# Patient Record
Sex: Male | Born: 1955 | Race: White | Hispanic: No | Marital: Married | Smoking: Never smoker
Health system: Southern US, Community
[De-identification: ages and names within clinical notes are randomized; demographics above are authoritative.]

## PROBLEM LIST (undated history)

## (undated) DIAGNOSIS — F1010 Alcohol abuse, uncomplicated: Secondary | ICD-10-CM

## (undated) DIAGNOSIS — X838XXA Intentional self-harm by other specified means, initial encounter: Secondary | ICD-10-CM

## (undated) DIAGNOSIS — R109 Unspecified abdominal pain: Secondary | ICD-10-CM

## (undated) DIAGNOSIS — F1910 Other psychoactive substance abuse, uncomplicated: Secondary | ICD-10-CM

## (undated) DIAGNOSIS — L400 Psoriasis vulgaris: Secondary | ICD-10-CM

## (undated) DIAGNOSIS — E119 Type 2 diabetes mellitus without complications: Secondary | ICD-10-CM

## (undated) DIAGNOSIS — E538 Deficiency of other specified B group vitamins: Secondary | ICD-10-CM

## (undated) DIAGNOSIS — D649 Anemia, unspecified: Secondary | ICD-10-CM

## (undated) DIAGNOSIS — Z6841 Body Mass Index (BMI) 40.0 and over, adult: Secondary | ICD-10-CM

## (undated) DIAGNOSIS — I10 Essential (primary) hypertension: Secondary | ICD-10-CM

## (undated) DIAGNOSIS — F32A Depression, unspecified: Secondary | ICD-10-CM

## (undated) DIAGNOSIS — M1990 Unspecified osteoarthritis, unspecified site: Secondary | ICD-10-CM

## (undated) DIAGNOSIS — E7800 Pure hypercholesterolemia, unspecified: Secondary | ICD-10-CM

## (undated) DIAGNOSIS — F329 Major depressive disorder, single episode, unspecified: Secondary | ICD-10-CM

## (undated) HISTORY — DX: Unspecified osteoarthritis, unspecified site: M19.90

## (undated) HISTORY — PX: ANKLE SURGERY: SHX546

---

## 2005-06-02 ENCOUNTER — Emergency Department: Payer: Self-pay | Admitting: Internal Medicine

## 2005-11-12 ENCOUNTER — Emergency Department: Payer: Self-pay | Admitting: General Practice

## 2009-08-26 ENCOUNTER — Observation Stay: Payer: Self-pay | Admitting: Surgery

## 2009-10-24 ENCOUNTER — Emergency Department: Payer: Self-pay | Admitting: Internal Medicine

## 2010-02-08 ENCOUNTER — Emergency Department: Payer: Self-pay | Admitting: Emergency Medicine

## 2010-02-28 ENCOUNTER — Ambulatory Visit: Payer: Self-pay | Admitting: Unknown Physician Specialty

## 2011-12-29 ENCOUNTER — Ambulatory Visit: Payer: Self-pay | Admitting: Family Medicine

## 2012-12-29 ENCOUNTER — Emergency Department: Payer: Self-pay | Admitting: Emergency Medicine

## 2012-12-29 LAB — CBC
HGB: 13.3 g/dL (ref 13.0–18.0)
MCH: 33.4 pg (ref 26.0–34.0)
MCHC: 35.6 g/dL (ref 32.0–36.0)
Platelet: 165 10*3/uL (ref 150–440)
RBC: 4 10*6/uL — ABNORMAL LOW (ref 4.40–5.90)
RDW: 12.5 % (ref 11.5–14.5)
WBC: 11 10*3/uL — ABNORMAL HIGH (ref 3.8–10.6)

## 2012-12-29 LAB — COMPREHENSIVE METABOLIC PANEL
Albumin: 3.3 g/dL — ABNORMAL LOW (ref 3.4–5.0)
Anion Gap: 8 (ref 7–16)
Bilirubin,Total: 0.6 mg/dL (ref 0.2–1.0)
Calcium, Total: 7.8 mg/dL — ABNORMAL LOW (ref 8.5–10.1)
Chloride: 106 mmol/L (ref 98–107)
Co2: 27 mmol/L (ref 21–32)
Creatinine: 0.71 mg/dL (ref 0.60–1.30)
EGFR (African American): >60
Glucose: 166 mg/dL — ABNORMAL HIGH (ref 65–99)
Potassium: 3.5 mmol/L (ref 3.5–5.1)
SGOT(AST): 61 U/L — ABNORMAL HIGH (ref 15–37)
Sodium: 141 mmol/L (ref 136–145)
Total Protein: 6.3 g/dL — ABNORMAL LOW (ref 6.4–8.2)

## 2012-12-29 LAB — ETHANOL
Ethanol %: 0.338 % (ref 0.000–0.080)
Ethanol: 338 mg/dL

## 2012-12-29 LAB — SALICYLATE LEVEL: Salicylates, Serum: <1.7 mg/dL

## 2012-12-29 LAB — ACETAMINOPHEN LEVEL: Acetaminophen: <2 ug/mL

## 2012-12-31 LAB — DRUG SCREEN, URINE

## 2013-01-06 ENCOUNTER — Emergency Department: Payer: Self-pay | Admitting: Internal Medicine

## 2013-02-22 ENCOUNTER — Emergency Department (HOSPITAL_COMMUNITY)
Admission: EM | Admit: 2013-02-22 | Discharge: 2013-02-23 | Discharge status: Against Medical Advice | Payer: BC Managed Care – PPO | Attending: Emergency Medicine | Admitting: Emergency Medicine

## 2013-02-22 ENCOUNTER — Encounter (HOSPITAL_COMMUNITY): Payer: Self-pay | Admitting: Emergency Medicine

## 2013-02-22 DIAGNOSIS — F329 Major depressive disorder, single episode, unspecified: Secondary | ICD-10-CM | POA: Insufficient documentation

## 2013-02-22 DIAGNOSIS — I10 Essential (primary) hypertension: Secondary | ICD-10-CM | POA: Insufficient documentation

## 2013-02-22 DIAGNOSIS — F1010 Alcohol abuse, uncomplicated: Secondary | ICD-10-CM

## 2013-02-22 DIAGNOSIS — F10939 Alcohol use, unspecified with withdrawal, unspecified: Secondary | ICD-10-CM | POA: Insufficient documentation

## 2013-02-22 DIAGNOSIS — F3289 Other specified depressive episodes: Secondary | ICD-10-CM | POA: Insufficient documentation

## 2013-02-22 DIAGNOSIS — F10929 Alcohol use, unspecified with intoxication, unspecified: Secondary | ICD-10-CM

## 2013-02-22 DIAGNOSIS — F10239 Alcohol dependence with withdrawal, unspecified: Secondary | ICD-10-CM | POA: Insufficient documentation

## 2013-02-22 HISTORY — DX: Major depressive disorder, single episode, unspecified: F32.9

## 2013-02-22 HISTORY — DX: Depression, unspecified: F32.A

## 2013-02-22 HISTORY — DX: Essential (primary) hypertension: I10

## 2013-02-22 LAB — CBC
Hemoglobin: 14.8 g/dL (ref 13.0–17.0)
MCH: 33.6 pg (ref 26.0–34.0)
MCHC: 36.5 g/dL — ABNORMAL HIGH (ref 30.0–36.0)
RDW: 13 % (ref 11.5–15.5)
WBC: 8.3 10*3/uL (ref 4.0–10.5)

## 2013-02-22 LAB — COMPREHENSIVE METABOLIC PANEL
ALT: 43 U/L (ref 0–53)
AST: 65 U/L — ABNORMAL HIGH (ref 0–37)
Albumin: 3.8 g/dL (ref 3.5–5.2)
Alkaline Phosphatase: 79 U/L (ref 39–117)
BUN: 22 mg/dL (ref 6–23)
Calcium: 9 mg/dL (ref 8.4–10.5)
Creatinine, Ser: 1.22 mg/dL (ref 0.50–1.35)
GFR calc Af Amer: 75 mL/min — ABNORMAL LOW (ref >90)
Glucose, Bld: 165 mg/dL — ABNORMAL HIGH (ref 70–99)
Potassium: 3.2 mEq/L — ABNORMAL LOW (ref 3.5–5.1)
Sodium: 133 mEq/L — ABNORMAL LOW (ref 135–145)
Total Protein: 7.2 g/dL (ref 6.0–8.3)

## 2013-02-22 LAB — RAPID URINE DRUG SCREEN, HOSP PERFORMED
Benzodiazepines: NOT DETECTED
Cocaine: NOT DETECTED
Opiates: NOT DETECTED
Tetrahydrocannabinol: NOT DETECTED

## 2013-02-22 LAB — ACETAMINOPHEN LEVEL: Acetaminophen (Tylenol), Serum: <15 ug/mL (ref 10–30)

## 2013-02-22 LAB — ETHANOL: Alcohol, Ethyl (B): 403 mg/dL (ref 0–11)

## 2013-02-22 MED ORDER — SODIUM CHLORIDE 0.9 % IV BOLUS (SEPSIS)
1000.0000 mL | Freq: Once | INTRAVENOUS | Status: AC
  Administered 2013-02-22: 1000 mL via INTRAVENOUS

## 2013-02-22 MED ORDER — VITAMIN B-1 100 MG PO TABS
100.0000 mg | ORAL_TABLET | Freq: Every day | ORAL | Status: DC
  Administered 2013-02-22: 100 mg via ORAL
  Filled 2013-02-22: qty 1

## 2013-02-22 MED ORDER — ONDANSETRON 4 MG PO TBDP
8.0000 mg | ORAL_TABLET | ORAL | Status: AC
  Administered 2013-02-22: 8 mg via ORAL
  Filled 2013-02-22: qty 2

## 2013-02-22 MED ORDER — THIAMINE HCL 100 MG/ML IJ SOLN: 100.0000 mg | Freq: Every day | INTRAMUSCULAR | Status: DC

## 2013-02-22 MED ORDER — LORAZEPAM 1 MG PO TABS: 0.0000 mg | ORAL_TABLET | Freq: Two times a day (BID) | ORAL | Status: DC

## 2013-02-22 MED ORDER — LORAZEPAM 1 MG PO TABS
0.0000 mg | ORAL_TABLET | Freq: Four times a day (QID) | ORAL | Status: DC
  Administered 2013-02-22 – 2013-02-23 (×2): 1 mg via ORAL
  Administered 2013-02-23: 2 mg via ORAL
  Filled 2013-02-22: qty 2
  Filled 2013-02-22 (×2): qty 1
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
## 2013-02-22 NOTE — ED Notes (Signed)
Peter Mccann, wife [phone]. Please call wife with update when known.

## 2013-02-22 NOTE — ED Notes (Signed)
Telepsych being performed at the bedside.

## 2013-02-22 NOTE — ED Provider Notes (Signed)
CSN: [id number]     Arrival date & time 02/22/13  1528 History   First MD Initiated Contact with Patient 02/22/13 1553     Chief Complaint  Patient presents with  . Medical Clearance   (Consider location/radiation/quality/duration/timing/severity/associated sxs/prior Treatment) HPI Comments: Patient presents to the ED with a chief complaint of intoxication.  He is accompanied by his family who state that they and the patient would like to have the patient admitted for detox.  Patient states that he drinks about 10 drinks per day.  Family members state that he has been very depressed and has been drunk since Thursday.  He denies any SI or HI.  Denies any other symptoms at this time.  Patient denies headache, blurred vision, new hearing loss, sore throat, chest pain, shortness of breath, nausea, vomiting, diarrhea, constipation, dysuria, peripheral edema, back pain, numbness or tingling of the extremities.   The history is provided by the patient. No language interpreter was used.    Past Medical History  Diagnosis Date  . Depression   . Hypertension    History reviewed. No pertinent past surgical history. History reviewed. No pertinent family history. History  Substance Use Topics  . Smoking status: Not on file  . Smokeless tobacco: Current User    Types: Chew  . Alcohol Use: Yes     Comment: heavy    Review of Systems  All other systems reviewed and are negative.    Allergies  Review of patient's allergies indicates no known allergies.  Home Medications  No current outpatient prescriptions on file. BP 121/54  Pulse 60  Temp(Src) 97.9 F (36.6 C) (Oral)  Resp 16  SpO2 96% Physical Exam  Nursing note and vitals reviewed. Constitutional: He is oriented to person, place, and time. He appears well-developed and well-nourished.  intoxicated  HENT:  Head: Normocephalic and atraumatic.  Eyes: Conjunctivae and EOM are normal. Pupils are equal, round, and reactive to light.  Right eye exhibits no discharge. Left eye exhibits no discharge. No scleral icterus.  Neck: Normal range of motion. Neck supple. No JVD present.  Cardiovascular: Normal rate, regular rhythm, normal heart sounds and intact distal pulses.  Exam reveals no gallop and no friction rub.   No murmur heard. Pulmonary/Chest: Effort normal and breath sounds normal. No respiratory distress. He has no wheezes. He has no rales. He exhibits no tenderness.  Abdominal: Soft. Bowel sounds are normal. He exhibits no distension and no mass. There is no tenderness. There is no rebound and no guarding.  Musculoskeletal: Normal range of motion. He exhibits no edema and no tenderness.  Neurological: He is alert and oriented to person, place, and time.  Skin: Skin is warm and dry.  Psychiatric:  intoxicated    ED Course  Procedures (including critical care time) Labs Review Labs Reviewed  ACETAMINOPHEN LEVEL  CBC  COMPREHENSIVE METABOLIC PANEL  ETHANOL  SALICYLATE LEVEL  URINE RAPID DRUG SCREEN (HOSP PERFORMED)   Results for orders placed during the hospital encounter of 02/22/13  ACETAMINOPHEN LEVEL      Result Value Range   Acetaminophen (Tylenol), Serum <15.0  10 - 30 ug/mL  CBC      Result Value Range   WBC 8.3  4.0 - 10.5 K/uL   RBC 4.41  4.22 - 5.81 MIL/uL   Hemoglobin 14.8  13.0 - 17.0 g/dL   HCT 59.5  63.8 - 75.6 %   MCV 92.1  78.0 - 100.0 fL   MCH 33.6  26.0 -  34.0 pg   MCHC 36.5 (*) 30.0 - 36.0 g/dL   RDW 16.1  09.6 - 04.5 %   Platelets 192  150 - 400 K/uL  COMPREHENSIVE METABOLIC PANEL      Result Value Range   Sodium 133 (*) 135 - 145 mEq/L   Potassium 3.2 (*) 3.5 - 5.1 mEq/L   Chloride 88 (*) 96 - 112 mEq/L   CO2 27  19 - 32 mEq/L   Glucose, Bld 165 (*) 70 - 99 mg/dL   BUN 22  6 - 23 mg/dL   Creatinine, Ser 4.09  0.50 - 1.35 mg/dL   Calcium 9.0  8.4 - 81.1 mg/dL   Total Protein 7.2  6.0 - 8.3 g/dL   Albumin 3.8  3.5 - 5.2 g/dL   AST 65 (*) 0 - 37 U/L   ALT 43  0 - 53 U/L    Alkaline Phosphatase 79  39 - 117 U/L   Total Bilirubin 0.6  0.3 - 1.2 mg/dL   GFR calc non Af Amer 65 (*) >90 mL/min   GFR calc Af Amer 75 (*) >90 mL/min  ETHANOL      Result Value Range   Alcohol, Ethyl (B) 403 (*) 0 - 11 mg/dL  SALICYLATE LEVEL      Result Value Range   Salicylate Lvl <2.0 (*) 2.8 - 20.0 mg/dL  URINE RAPID DRUG SCREEN (HOSP PERFORMED)      Result Value Range   Opiates NONE DETECTED  NONE DETECTED   Cocaine NONE DETECTED  NONE DETECTED   Benzodiazepines NONE DETECTED  NONE DETECTED   Amphetamines NONE DETECTED  NONE DETECTED   Tetrahydrocannabinol NONE DETECTED  NONE DETECTED   Barbiturates NONE DETECTED  NONE DETECTED     EKG Interpretation   None       MDM  No diagnosis found.  Patient who is intoxicated, requesting detox.  Will check basic labs, consult TTS, and re-evaluate.  CIWA is ordered.  11:14 PM  Patient evaluated by TTS.  Inpatient treatment for detox is recommended.  Berna Spare, from [facility] is working on a bed.    Roxy Horseman, PA-C 02/22/13 2314

## 2013-02-22 NOTE — ED Notes (Signed)
Pt reports being intoxicated and needs to gets sober. Denies any SI or HI. Family reports pt being depressed and malnourished.

## 2013-02-23 MED ORDER — ADULT MULTIVITAMIN W/MINERALS CH: 1.0000 | ORAL_TABLET | Freq: Every day | ORAL | Status: DC

## 2013-02-23 MED ORDER — FOLIC ACID 1 MG PO TABS: 1.0000 mg | ORAL_TABLET | Freq: Every day | ORAL | Status: DC

## 2013-02-23 MED ORDER — ONDANSETRON HCL 4 MG PO TABS
8.0000 mg | ORAL_TABLET | Freq: Two times a day (BID) | ORAL | Status: DC | PRN
  Administered 2013-02-23: 8 mg via ORAL
  Filled 2013-02-23: qty 2

## 2013-02-23 NOTE — ED Notes (Addendum)
Belongings inventoried. Placed in bins and locked with security.

## 2013-02-23 NOTE — ED Provider Notes (Signed)
Medical screening examination/treatment/procedure(s) were performed by non-physician practitioner and as supervising physician I was immediately available for consultation/collaboration.  Flint Melter, MD 02/23/13 [phone]

## 2013-02-23 NOTE — ED Provider Notes (Signed)
11:41 AM Pt is a 57 y.o. M with history of depression, hypertension, hyperlipidemia and alcohol abuse who presented to the emergency department 20 hours ago requesting detox from alcohol. He states that his last episode of sobriety lasted for 7 years and this was approximately 10 years ago. He reports he drinks a fifth of liquor a day. Denies any history of complicated withdrawal O. withdrawal seizures. Denies any suicidal or homicidal ideation. No hallucinations or delusions. Patient reports that he is not feeling well, feels anxious and nauseous and would like discharge home. I have offered him multiple medications to control his symptoms and have strongly encouraged him to stay in the hospital for placement for alcohol detox. He does not meet criteria at this point for medical detox and I feel his symptoms can be managed with oral Ativan and Zofran. Patient reports that he would still like to go home. He understands there are risks with leaving the hospital AGAINST MEDICAL ADVICE. He is competent to make this decision. He is able to verbalize the risks back to me. His wife at bedside agrees with the plan. Have discussed with them return precautions and returning to the ED if they change their mind. He has no current safety concerns. He does not want to stay to receive discharge paperwork or discharge medications to help control symptoms at home. I believe that he plans to go home and continue drinking.  Layla Maw Ward, DO 02/23/13 1147

## 2013-02-23 NOTE — ED Notes (Signed)
Pt d/c'd with wife- aware of all risks of leaving, stressed importance of staying and admission. Diaphoretic, tremors noticeable, ambulated to exit

## 2013-02-23 NOTE — ED Notes (Signed)
Pt states has drank heavily for 6 months -- 1 fifth vodka/day and also has been drinking for 5 years. Prior to that was sober for 7 years.

## 2013-02-23 NOTE — ED Notes (Signed)
Attempted to convince pt to stay. Still wanting to leave. Encouraged pt to return if withdrawal symptoms occur. Dr. Oletta Lamas informed. Resource guide given.

## 2013-02-23 NOTE — ED Notes (Signed)
Pt spoke with wife on the phone--will be in to visit. Unsure if he wants treatment at this time. States will talk with wife once she gets here.

## 2013-02-23 NOTE — ED Notes (Signed)
Pt. Arrived in POD C actively vomiting. Zofran given. Pt. States "I'm nauseous because of the chew I have in my mouth". Pt. Told of policies regarding tobacco use. Pt. Spit out tobacco in sink and rinsed out mouth. Pt. States he is still nauseous but is not vomiting at this time.

## 2016-01-22 ENCOUNTER — Other Ambulatory Visit: Payer: Self-pay | Admitting: Family Medicine

## 2016-03-23 ENCOUNTER — Other Ambulatory Visit: Payer: Self-pay | Admitting: Family Medicine

## 2016-03-26 ENCOUNTER — Other Ambulatory Visit: Payer: Self-pay | Admitting: Family Medicine

## 2016-04-26 ENCOUNTER — Other Ambulatory Visit: Payer: Self-pay | Admitting: Family Medicine

## 2016-05-09 ENCOUNTER — Other Ambulatory Visit: Payer: Self-pay | Admitting: Family Medicine

## 2016-05-23 ENCOUNTER — Other Ambulatory Visit: Payer: Self-pay | Admitting: Family Medicine

## 2016-08-06 ENCOUNTER — Encounter: Payer: Self-pay | Admitting: Emergency Medicine

## 2016-08-06 ENCOUNTER — Emergency Department
Admission: EM | Admit: 2016-08-06 | Discharge: 2016-08-07 | Disposition: A | Discharge status: Home / Self Care | Payer: BLUE CROSS/BLUE SHIELD | Attending: Emergency Medicine | Admitting: Emergency Medicine

## 2016-08-06 DIAGNOSIS — F17290 Nicotine dependence, other tobacco product, uncomplicated: Secondary | ICD-10-CM | POA: Diagnosis not present

## 2016-08-06 DIAGNOSIS — F1010 Alcohol abuse, uncomplicated: Secondary | ICD-10-CM | POA: Insufficient documentation

## 2016-08-06 DIAGNOSIS — F329 Major depressive disorder, single episode, unspecified: Secondary | ICD-10-CM | POA: Diagnosis not present

## 2016-08-06 DIAGNOSIS — R45851 Suicidal ideations: Secondary | ICD-10-CM | POA: Diagnosis present

## 2016-08-06 DIAGNOSIS — Z7982 Long term (current) use of aspirin: Secondary | ICD-10-CM | POA: Diagnosis not present

## 2016-08-06 DIAGNOSIS — I10 Essential (primary) hypertension: Secondary | ICD-10-CM | POA: Diagnosis not present

## 2016-08-06 DIAGNOSIS — E119 Type 2 diabetes mellitus without complications: Secondary | ICD-10-CM | POA: Diagnosis not present

## 2016-08-06 DIAGNOSIS — Z79899 Other long term (current) drug therapy: Secondary | ICD-10-CM | POA: Insufficient documentation

## 2016-08-06 DIAGNOSIS — Z7984 Long term (current) use of oral hypoglycemic drugs: Secondary | ICD-10-CM | POA: Insufficient documentation

## 2016-08-06 DIAGNOSIS — F32A Depression, unspecified: Secondary | ICD-10-CM

## 2016-08-06 DIAGNOSIS — Z046 Encounter for general psychiatric examination, requested by authority: Secondary | ICD-10-CM

## 2016-08-06 HISTORY — DX: Type 2 diabetes mellitus without complications: E11.9

## 2016-08-06 HISTORY — DX: Pure hypercholesterolemia, unspecified: E78.00

## 2016-08-06 HISTORY — DX: Alcohol abuse, uncomplicated: F10.10

## 2016-08-06 LAB — CBC
HCT: 41.2 % (ref 40.0–52.0)
HEMOGLOBIN: 14.2 g/dL (ref 13.0–18.0)
MCH: 31.9 pg (ref 26.0–34.0)
MCHC: 34.3 g/dL (ref 32.0–36.0)
MCV: 92.9 fL (ref 80.0–100.0)
PLATELETS: 213 10*3/uL (ref 150–440)
RBC: 4.44 MIL/uL (ref 4.40–5.90)
RDW: 14 % (ref 11.5–14.5)
WBC: 13.3 10*3/uL — AB (ref 3.8–10.6)

## 2016-08-06 LAB — COMPREHENSIVE METABOLIC PANEL
ALT: 44 U/L (ref 17–63)
AST: 51 U/L — ABNORMAL HIGH (ref 15–41)
Albumin: 3.8 g/dL (ref 3.5–5.0)
Alkaline Phosphatase: 82 U/L (ref 38–126)
Anion gap: 13 (ref 5–15)
BILIRUBIN TOTAL: 0.9 mg/dL (ref 0.3–1.2)
BUN: 21 mg/dL — AB (ref 6–20)
CO2: 24 mmol/L (ref 22–32)
Calcium: 8.3 mg/dL — ABNORMAL LOW (ref 8.9–10.3)
Chloride: 103 mmol/L (ref 101–111)
Creatinine, Ser: 0.95 mg/dL (ref 0.61–1.24)
GFR calc non Af Amer: >60 mL/min (ref >60)
Glucose, Bld: 118 mg/dL — ABNORMAL HIGH (ref 65–99)
POTASSIUM: 3.5 mmol/L (ref 3.5–5.1)
Sodium: 140 mmol/L (ref 135–145)
TOTAL PROTEIN: 7.2 g/dL (ref 6.5–8.1)

## 2016-08-06 LAB — ETHANOL: Alcohol, Ethyl (B): 415 mg/dL (ref <5)

## 2016-08-06 LAB — ACETAMINOPHEN LEVEL: Acetaminophen (Tylenol), Serum: <10 ug/mL — ABNORMAL LOW (ref 10–30)

## 2016-08-06 LAB — SALICYLATE LEVEL: Salicylate Lvl: <7 mg/dL (ref 2.8–30.0)

## 2016-08-06 MED ORDER — HALOPERIDOL LACTATE 5 MG/ML IJ SOLN
INTRAMUSCULAR | Status: AC
  Administered 2016-08-06: 5 mg via INTRAMUSCULAR
  Filled 2016-08-06: qty 1

## 2016-08-06 MED ORDER — MIDAZOLAM HCL 2 MG/2ML IJ SOLN
INTRAMUSCULAR | Status: AC
  Filled 2016-08-06: qty 2

## 2016-08-06 MED ORDER — LORAZEPAM 2 MG PO TABS: 0.0000 mg | ORAL_TABLET | Freq: Two times a day (BID) | ORAL | Status: DC

## 2016-08-06 MED ORDER — SODIUM CHLORIDE 0.9 % IV BOLUS (SEPSIS)
1000.0000 mL | Freq: Once | INTRAVENOUS | Status: AC
  Administered 2016-08-06: 1000 mL via INTRAVENOUS

## 2016-08-06 MED ORDER — LORAZEPAM 2 MG PO TABS
0.0000 mg | ORAL_TABLET | Freq: Four times a day (QID) | ORAL | Status: DC
  Administered 2016-08-07: 1 mg via ORAL

## 2016-08-06 MED ORDER — MIDAZOLAM HCL 2 MG/2ML IJ SOLN
2.0000 mg | Freq: Once | INTRAMUSCULAR | Status: AC
  Administered 2016-08-06: 2 mg via INTRAMUSCULAR

## 2016-08-06 MED ORDER — HALOPERIDOL LACTATE 5 MG/ML IJ SOLN
5.0000 mg | Freq: Once | INTRAMUSCULAR | Status: AC
  Administered 2016-08-06: 5 mg via INTRAMUSCULAR

## 2016-08-06 MED ORDER — DIPHENHYDRAMINE HCL 50 MG/ML IJ SOLN
25.0000 mg | Freq: Once | INTRAMUSCULAR | Status: AC
  Administered 2016-08-06: 25 mg via INTRAMUSCULAR

## 2016-08-06 MED ORDER — DIPHENHYDRAMINE HCL 50 MG/ML IJ SOLN
INTRAMUSCULAR | Status: AC
  Filled 2016-08-06: qty 1

## 2016-08-06 MED ORDER — THIAMINE HCL 100 MG/ML IJ SOLN: 100.0000 mg | Freq: Every day | INTRAMUSCULAR | Status: DC

## 2016-08-06 MED ORDER — VITAMIN B-1 100 MG PO TABS: 100.0000 mg | ORAL_TABLET | Freq: Every day | ORAL | Status: DC

## 2016-08-06 NOTE — ED Triage Notes (Signed)
Pt admits to being highly intoxicated; slurred speech with cursing in triage; wife and son say pt has been on a drinking binge for several days and expressing suicidal thoughts today; wife says pt has fallen several times today without injury; pt denies falls; pt says he's tired of living and just wants to shoot himself;

## 2016-08-06 NOTE — ED Notes (Signed)
Pt admits to drinking 1/2 pint of vodka per day. Pt admits to being a "functional drunk" even at work

## 2016-08-06 NOTE — ED Notes (Addendum)
Pt got angry , yelling, argumentative and pulling off EKG cords, and leaned forward -IV infiltrated. Paused fluids until pt calms ; MD aware

## 2016-08-06 NOTE — ED Provider Notes (Signed)
[facility] Emergency Department Provider Note  ____________________________________________   First MD Initiated Contact with Patient 08/06/16 2153     (approximate)  I have reviewed the triage vital signs and the nursing notes.   HISTORY  Chief Complaint Suicidal  Patient is heavily intoxicated which limits his ability to give a reliable history  HPI Peter Mccann is a 61 y.o. male with a history of heavy and chronic alcohol abuse who presents with his family by private vehicle for evaluation of worsening depression, suicidal ideation and threats, and heavy alcohol intoxication.  The patient is uncooperative and hostile, threatening to "kick your ass", and had to be physically redirected by security officers and given calming medications since he does not have the capacity to make any of his own decisions, is threatening suicide, and is threatening physical violence on others.  His son and wife confirmed that he is a chronic alcoholic but that his depression has been increasing recently.  Prior to coming to the emergency department he asked his son to bring him, stated that he knows he needs help, he feels like his life is worthless and does not want to go on living.  His son reports that he has multiple firearms and multiple ways that he could kill himself.  They state that his drinking has been getting gradually worse but that he has essentially been drinking constantly for the last 3 days.   Past Medical History:  Diagnosis Date  . Alcohol abuse   . Depression   . Diabetes (HCC)   . High cholesterol   . Hypertension     There are no active problems to display for this patient.   History reviewed. No pertinent surgical history.  Prior to Admission medications   Medication Sig Start Date End Date Taking? Authorizing Provider  atorvastatin (LIPITOR) 40 MG tablet Take 40 mg by mouth daily. 02/22/13  Yes Historical Provider, MD  PARoxetine (PAXIL)  20 MG tablet TAKE THREE TABLETS BY MOUTH DAILY 03/27/16  Yes Teah Doles-Johnson, NP  telmisartan-hydrochlorothiazide (MICARDIS HCT) 80-25 MG per tablet Take 1 tablet by mouth daily. 02/01/13  Yes Historical Provider, MD    Allergies Patient has no known allergies.  History reviewed. No pertinent family history.  Social History Social History  Substance Use Topics  . Smoking status: Never Smoker  . Smokeless tobacco: Current User    Types: Snuff  . Alcohol use Yes     Comment: heavy, chronic    Review of Systems Level 5 caveat:  history/ROS limited by acute/critical illness  ____________________________________________   PHYSICAL EXAM:  VITAL SIGNS: ED Triage Vitals  Enc Vitals Group     BP 08/06/16 2113 (!) 84/51     Pulse Rate 08/06/16 2113 67     Resp 08/06/16 2113 18     Temp 08/06/16 2113 97.7 F (36.5 C)     Temp Source 08/06/16 2113 Oral     SpO2 08/06/16 2113 97 %     Weight 08/06/16 2113 265 lb (120.2 kg)     Height 08/06/16 2113 5' 8.5" (1.74 m)     Head Circumference --      Peak Flow --      Pain Score 08/06/16 2114 0     Pain Loc --      Pain Edu? --      Excl. in GC? --     Constitutional: Alert And conversant but slurred speech and obviously heavily intoxicated. Eyes: Conjunctivae are  normal. PERRL. EOMI. Head: Atraumatic. Nose: No congestion/rhinnorhea. Mouth/Throat: Mucous membranes are moist. Neck: No stridor.  No meningeal signs.   Cardiovascular: Normal rate, regular rhythm. Good peripheral circulation. Grossly normal heart sounds. Respiratory: Normal respiratory effort.  No retractions. Lungs CTAB. Gastrointestinal: Soft and nontender. No distention.  Musculoskeletal: No lower extremity tenderness nor edema. No gross deformities of extremities. Neurologic:  Heavily slurred speech. Unsteady on feet, but no focal neuro deficits. Skin:  Skin is warm, dry and intact. No rash noted. Psychiatric: slurred speech, hostile behavior, danger to  himself and others without the capacity to make his own decisions.  Endorsed suicidal ideation to son and wife prior to coming to ED, asked for help, and has access to firearms and vehicles.  ____________________________________________   LABS (all labs ordered are listed, but only abnormal results are displayed)  Labs Reviewed  COMPREHENSIVE METABOLIC PANEL - Abnormal; Notable for the following:       Result Value   Glucose, Bld 118 (*)    BUN 21 (*)    Calcium 8.3 (*)    AST 51 (*)    All other components within normal limits  CBC - Abnormal; Notable for the following:    WBC 13.3 (*)    All other components within normal limits  ETHANOL  SALICYLATE LEVEL  ACETAMINOPHEN LEVEL  URINE DRUG SCREEN, QUALITATIVE ([facility] ONLY)   ____________________________________________  EKG  None - EKG not ordered by ED physician ____________________________________________  RADIOLOGY   No results found.  ____________________________________________   PROCEDURES  Critical Care performed: Yes, see critical care procedure note(s)   Procedure(s) performed:   .Critical Care Performed by: Loleta RoseFORBACH, Gera Inboden Authorized by: Loleta RoseFORBACH, Laithan Conchas   Critical care provider statement:    Critical care time (minutes):  30   Critical care time was exclusive of:  Separately billable procedures and treating other patients   Critical care was necessary to treat or prevent imminent or life-threatening deterioration of the following conditions:  Toxidrome (alcohol; required emergent psychiatric intervention for suicidal ideation/threats)   Critical care was time spent personally by me on the following activities:  Development of treatment plan with patient or surrogate, discussions with consultants, evaluation of patient's response to treatment, examination of patient, obtaining history from patient or surrogate, ordering and performing treatments and interventions, ordering and review of laboratory studies,  ordering and review of radiographic studies, pulse oximetry, re-evaluation of patient's condition and review of old charts     ____________________________________________   INITIAL IMPRESSION / ASSESSMENT AND PLAN / ED COURSE  Pertinent labs & imaging results that were available during my care of the patient were reviewed by me and considered in my medical decision making (see chart for details).  The patient is heavily intoxicated and threatening myself and staff members.  He does not have the capacity to make his own decisions and required calming agents to keep him and those around him safe.  I had an extensive discussion with the patient's son and wife and they both agree that he very much needs help, and then a fact he was asking for prior to coming here.  I placed him under emergency commitment in order to have the security officers redirect him and give him the calming medications which include Haldol 5 mg, Versed 2 mg, and Benadryl 25 mg, all given intramuscularly.  He initially was found to be hypotensive in triage but I do not believe these were accurate measurements given that the patient was ambulatory and in no  distress.  After he calmed down his blood pressure was within normal limits.  I am giving a small fluid bolus and we will reassess.  He will need psychiatric consultation when he is clinically sober.      ____________________________________________  FINAL CLINICAL IMPRESSION(S) / ED DIAGNOSES  Final diagnoses:  Depression, unspecified depression type  Suicidal ideation  Alcohol abuse  Involuntary commitment     MEDICATIONS GIVEN DURING THIS VISIT:  Medications  LORazepam (ATIVAN) tablet 0-4 mg (not administered)    Followed by  LORazepam (ATIVAN) tablet 0-4 mg (not administered)  thiamine (VITAMIN B-1) tablet 100 mg (not administered)    Or  thiamine (B-1) injection 100 mg (not administered)  sodium chloride 0.9 % bolus 1,000 mL (1,000 mLs Intravenous  Restarted 08/06/16 2229)  diphenhydrAMINE (BENADRYL) injection 25 mg (25 mg Intramuscular Given 08/06/16 2210)  haloperidol lactate (HALDOL) injection 5 mg (5 mg Intramuscular Given 08/06/16 2210)  midazolam (VERSED) injection 2 mg (2 mg Intramuscular Given 08/06/16 2210)     NEW OUTPATIENT MEDICATIONS STARTED DURING THIS VISIT:  New Prescriptions   No medications on file    Modified Medications   No medications on file    Discontinued Medications   ANDROGEL 50 MG/5GM GEL         Note:  This document was prepared using Dragon voice recognition software and may include unintentional dictation errors.    Loleta Rose, MD 08/06/16 [phone]

## 2016-08-06 NOTE — ED Notes (Signed)
Assigned as Recruitment consultantsafety sitter. Pt asleep and safe.AS

## 2016-08-06 NOTE — Progress Notes (Signed)
TTS is unable to assess the patient at this time. Patient has a BAC of 415. Patient was combative and was medicated.

## 2016-08-06 NOTE — ED Notes (Addendum)
Pt's family- wife and son left for home. They stated that pt can be very aggressive in the morning after he wakes up and finds that he is not at work but in the hospital. Concerned of safety in the morning and if "there is going to be something in place"

## 2016-08-07 LAB — URINE DRUG SCREEN, QUALITATIVE (ARMC ONLY)
AMPHETAMINES, UR SCREEN: NOT DETECTED
BENZODIAZEPINE, UR SCRN: POSITIVE — AB
Barbiturates, Ur Screen: NOT DETECTED
CANNABINOID 50 NG, UR ~~LOC~~: NOT DETECTED
Cocaine Metabolite,Ur ~~LOC~~: NOT DETECTED
MDMA (Ecstasy)Ur Screen: NOT DETECTED
Methadone Scn, Ur: NOT DETECTED
Opiate, Ur Screen: NOT DETECTED
Phencyclidine (PCP) Ur S: NOT DETECTED
Tricyclic, Ur Screen: NOT DETECTED

## 2016-08-07 MED ORDER — SODIUM CHLORIDE 0.9 % IV BOLUS (SEPSIS)
1000.0000 mL | Freq: Once | INTRAVENOUS | Status: AC
  Administered 2016-08-07: 1000 mL via INTRAVENOUS

## 2016-08-07 MED ORDER — LORAZEPAM 1 MG PO TABS
ORAL_TABLET | ORAL | Status: AC
  Filled 2016-08-07: qty 1

## 2016-08-07 NOTE — ED Notes (Signed)
BEHAVIORAL HEALTH ROUNDING Patient sleeping: No. Patient alert and oriented: yes Behavior appropriate: Yes.  ; If no, describe:  Nutrition and fluids offered: yes Toileting and hygiene offered: Yes  Sitter present: q15 minute observations and security  monitoring Law enforcement present: Yes  ODS  

## 2016-08-07 NOTE — ED Provider Notes (Signed)
-----------------------------------------   6:46 AM on 08/07/2016 -----------------------------------------   Blood pressure 103/64, pulse 64, temperature 97.7 F (36.5 C), temperature source Oral, resp. rate 18, height 5' 8.5" (1.74 m), weight 265 lb (120.2 kg), SpO2 96 %.  The patient had no acute events since last update.  Calm and cooperative at this time.  The patient is awake at this time we will have psych evaluate him.     Rebecka ApleyAllison P Trenise Turay, MD 08/07/16 [phone]

## 2016-08-07 NOTE — ED Notes (Signed)
As pt is sleeping soundly due to medication given earlier, sitter no longer at bedside-MD aware; pt being monitored closely by all staff in area; side rails up x 2
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
## 2016-08-07 NOTE — ED Notes (Addendum)
Pt awake; assisted to toilet to void and back to bed; pt says he doesn't feel mentally good; "I'm just not in a good spot mentally"; says he's tired of living but didn't really mean he'd shoot himself; says he tried to explain this to his wife but she just didn't understand; pt says he's sleepy and would like to go back to sleep; warm blanket provided for comfort; pt remains calm and cooperative

## 2016-08-07 NOTE — ED Notes (Signed)
ED BHU PLACEMENT JUSTIFICATION Is the patient under IVC or is there intent for IVC: Yes.   Is the patient medically cleared: Yes.   Is there vacancy in the ED BHU: Yes.   Is the population mix appropriate for patient: Yes.   Is the patient awaiting placement in inpatient or outpatient setting:  Has the patient had a psychiatric consult: consult pending .   Survey of unit performed for contraband, proper placement and condition of furniture, tampering with fixtures in bathroom, shower, and each patient room: Yes.  ; Findings:  APPEARANCE/BEHAVIOR Calm and cooperative NEURO ASSESSMENT Orientation: oriented x3  Denies pain Hallucinations: No.None noted (Hallucinations) Speech: Normal Gait: normal RESPIRATORY ASSESSMENT Even  Unlabored respirations  CARDIOVASCULAR ASSESSMENT Pulses equal   regular rate  Skin warm and dry   GASTROINTESTINAL ASSESSMENT no GI complaint EXTREMITIES Full ROM  PLAN OF CARE Provide calm/safe environment. Vital signs assessed twice daily. ED BHU Assessment once each 12-hour shift. Collaborate with TTS daily or as condition indicates. Assure the ED provider has rounded once each shift. Provide and encourage hygiene. Provide redirection as needed. Assess for escalating behavior; address immediately and inform ED provider.  Assess family dynamic and appropriateness for visitation as needed: Yes.  ; If necessary, describe findings:  Educate the patient/family about BHU procedures/visitation: Yes.  ; If necessary, describe findings:

## 2016-08-07 NOTE — ED Notes (Signed)
Introduced myself to him - discussed the plan of care  Pt transferred to room 23  NAD assessed

## 2016-08-07 NOTE — ED Notes (Signed)
Pt stirring, has turned over onto right side and pushed [place] up near his eyes; replaced in nares without waking patient;

## 2016-08-07 NOTE — ED Notes (Signed)
MD aware of recent BP; no new orders given

## 2016-08-07 NOTE — ED Notes (Signed)

## 2016-08-07 NOTE — ED Notes (Signed)
Eyes closed, resp even and unlabored 

## 2016-08-07 NOTE — ED Notes (Signed)
Pt awake; given water to drink; says he's hungry; will ask MD for diet order; pt calm and cooperative;

## 2016-08-07 NOTE — ED Provider Notes (Signed)
The patient has been evaluated at bedside by Dr. Lorna DibbleIannucci, psychiatry.  Patient is clinically stable.  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  Not felt to be a danger to self or others.  No SI or Hi.  No indication for inpatient psychiatric admission at this time. Appropriate for continued outpatient therapy.    Peter EddyPatrick Saleem Coccia, MD 08/07/16 [phone]

## 2017-04-13 ENCOUNTER — Other Ambulatory Visit: Payer: Self-pay | Admitting: Internal Medicine

## 2017-04-13 DIAGNOSIS — R1084 Generalized abdominal pain: Secondary | ICD-10-CM

## 2017-04-18 ENCOUNTER — Ambulatory Visit
Admission: RE | Admit: 2017-04-18 | Discharge: 2017-04-18 | Disposition: A | Discharge status: Home / Self Care | Payer: BLUE CROSS/BLUE SHIELD | Source: Ambulatory Visit | Attending: Internal Medicine | Admitting: Internal Medicine

## 2017-04-18 DIAGNOSIS — K449 Diaphragmatic hernia without obstruction or gangrene: Secondary | ICD-10-CM | POA: Insufficient documentation

## 2017-04-18 DIAGNOSIS — R1084 Generalized abdominal pain: Secondary | ICD-10-CM | POA: Insufficient documentation

## 2017-04-18 DIAGNOSIS — K219 Gastro-esophageal reflux disease without esophagitis: Secondary | ICD-10-CM | POA: Insufficient documentation

## 2017-05-11 ENCOUNTER — Other Ambulatory Visit: Payer: Self-pay | Admitting: Family Medicine

## 2017-05-11 DIAGNOSIS — R1084 Generalized abdominal pain: Secondary | ICD-10-CM

## 2017-05-16 ENCOUNTER — Ambulatory Visit
Admission: RE | Admit: 2017-05-16 | Discharge: 2017-05-16 | Disposition: A | Discharge status: Home / Self Care | Payer: BLUE CROSS/BLUE SHIELD | Source: Ambulatory Visit | Attending: Family Medicine | Admitting: Family Medicine

## 2017-05-16 DIAGNOSIS — R1084 Generalized abdominal pain: Secondary | ICD-10-CM | POA: Diagnosis present

## 2017-05-16 DIAGNOSIS — K760 Fatty (change of) liver, not elsewhere classified: Secondary | ICD-10-CM | POA: Diagnosis not present

## 2017-07-20 ENCOUNTER — Encounter: Payer: Self-pay | Admitting: *Deleted

## 2017-07-23 ENCOUNTER — Ambulatory Visit
Admission: RE | Admit: 2017-07-23 | Discharge: 2017-07-23 | Disposition: A | Discharge status: Home / Self Care | Payer: BLUE CROSS/BLUE SHIELD | Source: Ambulatory Visit | Attending: Unknown Physician Specialty | Admitting: Unknown Physician Specialty

## 2017-07-23 ENCOUNTER — Ambulatory Visit: Payer: BLUE CROSS/BLUE SHIELD | Admitting: Anesthesiology

## 2017-07-23 ENCOUNTER — Encounter
Admission: RE | Disposition: A | Discharge status: Home / Self Care | Payer: Self-pay | Source: Ambulatory Visit | Attending: Unknown Physician Specialty

## 2017-07-23 ENCOUNTER — Encounter: Payer: Self-pay | Admitting: *Deleted

## 2017-07-23 DIAGNOSIS — Z1211 Encounter for screening for malignant neoplasm of colon: Secondary | ICD-10-CM | POA: Diagnosis not present

## 2017-07-23 DIAGNOSIS — R1084 Generalized abdominal pain: Secondary | ICD-10-CM | POA: Diagnosis not present

## 2017-07-23 DIAGNOSIS — K640 First degree hemorrhoids: Secondary | ICD-10-CM | POA: Diagnosis not present

## 2017-07-23 DIAGNOSIS — F329 Major depressive disorder, single episode, unspecified: Secondary | ICD-10-CM | POA: Diagnosis not present

## 2017-07-23 DIAGNOSIS — Z800 Family history of malignant neoplasm of digestive organs: Secondary | ICD-10-CM | POA: Diagnosis not present

## 2017-07-23 DIAGNOSIS — E538 Deficiency of other specified B group vitamins: Secondary | ICD-10-CM | POA: Insufficient documentation

## 2017-07-23 DIAGNOSIS — E119 Type 2 diabetes mellitus without complications: Secondary | ICD-10-CM | POA: Insufficient documentation

## 2017-07-23 DIAGNOSIS — K2960 Other gastritis without bleeding: Secondary | ICD-10-CM | POA: Insufficient documentation

## 2017-07-23 DIAGNOSIS — Z7982 Long term (current) use of aspirin: Secondary | ICD-10-CM | POA: Diagnosis not present

## 2017-07-23 DIAGNOSIS — E7800 Pure hypercholesterolemia, unspecified: Secondary | ICD-10-CM | POA: Insufficient documentation

## 2017-07-23 DIAGNOSIS — I10 Essential (primary) hypertension: Secondary | ICD-10-CM | POA: Diagnosis not present

## 2017-07-23 DIAGNOSIS — Z7984 Long term (current) use of oral hypoglycemic drugs: Secondary | ICD-10-CM | POA: Insufficient documentation

## 2017-07-23 DIAGNOSIS — L400 Psoriasis vulgaris: Secondary | ICD-10-CM | POA: Insufficient documentation

## 2017-07-23 DIAGNOSIS — Z79899 Other long term (current) drug therapy: Secondary | ICD-10-CM | POA: Diagnosis not present

## 2017-07-23 HISTORY — DX: Intentional self-harm by other specified means, initial encounter: X83.8XXA

## 2017-07-23 HISTORY — DX: Psoriasis vulgaris: L40.0

## 2017-07-23 HISTORY — DX: Other psychoactive substance abuse, uncomplicated: F19.10

## 2017-07-23 HISTORY — DX: Body Mass Index (BMI) 40.0 and over, adult: Z684

## 2017-07-23 HISTORY — DX: Anemia, unspecified: D64.9

## 2017-07-23 HISTORY — PX: ESOPHAGOGASTRODUODENOSCOPY (EGD) WITH PROPOFOL: SHX5813

## 2017-07-23 HISTORY — DX: Deficiency of other specified B group vitamins: E53.8

## 2017-07-23 HISTORY — PX: COLONOSCOPY WITH PROPOFOL: SHX5780

## 2017-07-23 HISTORY — DX: Unspecified abdominal pain: R10.9

## 2017-07-23 LAB — GLUCOSE, CAPILLARY: GLUCOSE-CAPILLARY: 150 mg/dL — AB (ref 65–99)

## 2017-07-23 LAB — URINE DRUG SCREEN, QUALITATIVE (ARMC ONLY)
Amphetamines, Ur Screen: NOT DETECTED
BENZODIAZEPINE, UR SCRN: NOT DETECTED
Barbiturates, Ur Screen: NOT DETECTED
CANNABINOID 50 NG, UR ~~LOC~~: NOT DETECTED
Cocaine Metabolite,Ur ~~LOC~~: NOT DETECTED
MDMA (Ecstasy)Ur Screen: NOT DETECTED
Methadone Scn, Ur: NOT DETECTED
Opiate, Ur Screen: NOT DETECTED
PHENCYCLIDINE (PCP) UR S: NOT DETECTED
Tricyclic, Ur Screen: POSITIVE — AB

## 2017-07-23 SURGERY — ESOPHAGOGASTRODUODENOSCOPY (EGD) WITH PROPOFOL
Anesthesia: General

## 2017-07-23 MED ORDER — LIDOCAINE HCL (PF) 2 % IJ SOLN
INTRAMUSCULAR | Status: AC
  Filled 2017-07-23: qty 10

## 2017-07-23 MED ORDER — SODIUM CHLORIDE 0.9 % IV SOLN: INTRAVENOUS | Status: DC

## 2017-07-23 MED ORDER — ONDANSETRON HCL 4 MG/2ML IJ SOLN: 4.0000 mg | Freq: Once | INTRAMUSCULAR | Status: DC | PRN

## 2017-07-23 MED ORDER — PROPOFOL 500 MG/50ML IV EMUL
INTRAVENOUS | Status: AC
  Filled 2017-07-23: qty 50

## 2017-07-23 MED ORDER — LIDOCAINE HCL (CARDIAC) 20 MG/ML IV SOLN
INTRAVENOUS | Status: DC | PRN
  Administered 2017-07-23: 50 mg via INTRAVENOUS

## 2017-07-23 MED ORDER — FENTANYL CITRATE (PF) 100 MCG/2ML IJ SOLN: 25.0000 ug | INTRAMUSCULAR | Status: DC | PRN

## 2017-07-23 MED ORDER — PHENYLEPHRINE HCL 10 MG/ML IJ SOLN
INTRAMUSCULAR | Status: DC | PRN
  Administered 2017-07-23: 200 ug via INTRAVENOUS
  Administered 2017-07-23: 100 ug via INTRAVENOUS
  Administered 2017-07-23: 200 ug via INTRAVENOUS

## 2017-07-23 MED ORDER — SODIUM CHLORIDE 0.9 % IV SOLN
INTRAVENOUS | Status: DC
  Administered 2017-07-23: 12:00:00 via INTRAVENOUS

## 2017-07-23 MED ORDER — PROPOFOL 500 MG/50ML IV EMUL
INTRAVENOUS | Status: DC | PRN
  Administered 2017-07-23: 140 ug/kg/min via INTRAVENOUS

## 2017-07-23 MED ORDER — PROPOFOL 10 MG/ML IV BOLUS
INTRAVENOUS | Status: DC | PRN
  Administered 2017-07-23: 380 mg via INTRAVENOUS

## 2017-07-23 NOTE — Op Note (Signed)
[facility] Gastroenterology Patient Name: Peter BiddingLarry Swett Procedure Date: 07/23/2017 1:00 PM MRN: [id number] Account #: [id number] Date of Birth: 11/28/1955 Admit Type: Outpatient Age: [age] Room: [facility] END[facility]OM 3 Gender: Male Note Status: Finalized Procedure:            Upper GI endoscopy Indications:          Generalized abdominal pain Providers:            Scot Junobert T. Elliott, MD Referring MD:         Daniel NonesBert Klein, MD (Referring MD) Medicines:            Propofol per Anesthesia Complications:        No immediate complications. Procedure:            Pre-Anesthesia Assessment:                       - After reviewing the risks and benefits, the patient                        was deemed in satisfactory condition to undergo the                        procedure.                       After obtaining informed consent, the endoscope was                        passed under direct vision. Throughout the procedure,                        the patient's blood pressure, pulse, and oxygen                        saturations were monitored continuously. The Endoscope                        was introduced through the mouth, and advanced to the                        second part of duodenum. The upper GI endoscopy was                        accomplished without difficulty. The patient tolerated                        the procedure well. Findings:      The examined esophagus was normal. GEJ 40cm.      Diffuse and patchy mild inflammation characterized by erythema and       granularity was found in the gastric body and in the gastric antrum.       Biopsies were taken with a cold forceps for histology. Biopsies were       taken with a cold forceps for Helicobacter pylori testing.      The examined duodenum was normal. Impression:           - Normal esophagus.                       - Gastritis. Biopsied.                       -  Normal examined duodenum. Recommendation:       - Await  pathology results. Scot Jun, MD 07/23/2017 1:13:05 PM This report has been signed electronically. Number of Addenda: 0 Note Initiated On: 07/23/2017 1:00 PM      [facility]

## 2017-07-23 NOTE — H&P (Signed)
Primary Care Physician:  Lynnea FerrierKlein, Bert J III, MD Primary Gastroenterologist:  Dr. Mechele CollinElliott  Pre-Procedure History & Physical: HPI:  Peter CookeyLarry Dean Mccann is a 62 y.o. male is here for an endoscopy and colonoscopy.This is for colon cancer screening due to father had colon cancer and an EGD for generalized abd pain   Past Medical History:  Diagnosis Date  . Abdominal pain   . Alcohol abuse   . Anemia   . B12 deficiency   . BMI 40.0-44.9, adult (HCC)   . Depression   . Diabetes (HCC)   . High cholesterol   . Hypertension   . Plaque psoriasis   . Substance abuse (HCC)   . Suicide (HCC)    when using drugs    Past Surgical History:  Procedure Laterality Date  . ANKLE SURGERY Right     Prior to Admission medications   Medication Sig Start Date End Date Taking? Authorizing Provider  Adalimumab 40 MG/0.8ML PNKT Inject 40 mg into the skin every 14 (fourteen) days.   Yes [provider]  amLODipine (NORVASC) 10 MG tablet Take 10 mg by mouth daily. 07/23/16  Yes [provider]  carvedilol (COREG) 25 MG tablet Take 12.5 mg by mouth 2 (two) times daily. 05/30/16  Yes [provider]  glipiZIDE (GLUCOTROL XL) 5 MG 24 hr tablet Take 5 mg by mouth daily. 07/03/16  Yes [provider]  magnesium 30 MG tablet Take 30 mg by mouth 2 (two) times daily.   Yes [provider]  magnesium oxide (MAG-OX) 400 MG tablet Take 400 mg by mouth daily.   Yes [provider]  pantoprazole (PROTONIX) 40 MG tablet Take 40 mg by mouth 2 (two) times daily.   Yes [provider]  PARoxetine (PAXIL) 20 MG tablet TAKE THREE TABLETS BY MOUTH DAILY 03/27/16  Yes Doles-Johnson, Teah, NP  sucralfate (CARAFATE) 1 g tablet Take 1 g by mouth 4 (four) times daily.   Yes [provider]  telmisartan-hydrochlorothiazide (MICARDIS HCT) 80-25 MG per tablet Take 1 tablet by mouth daily. 02/01/13  Yes [provider]  aspirin EC 81 MG tablet Take 81 mg by  mouth daily.    [provider]  atorvastatin (LIPITOR) 80 MG tablet Take 80 mg by mouth daily. 06/19/16   [provider]  metFORMIN (GLUCOPHAGE) 1000 MG tablet Take 1,000 mg by mouth 2 (two) times daily. 06/19/16   [provider]    Allergies as of 07/01/2017  . (No Known Allergies)    History reviewed. No pertinent family history.  Social History   Socioeconomic History  . Marital status: Married    Spouse name: Not on file  . Number of children: Not on file  . Years of education: Not on file  . Highest education level: Not on file  Social Needs  . Financial resource strain: Not on file  . Food insecurity - worry: Not on file  . Food insecurity - inability: Not on file  . Transportation needs - medical: Not on file  . Transportation needs - non-medical: Not on file  Occupational History  . Not on file  Tobacco Use  . Smoking status: Never Smoker  . Smokeless tobacco: Current User    Types: Snuff  Substance and Sexual Activity  . Alcohol use: Yes    Comment: heavy, chronic pint of liquor a day  . Drug use: No    Comment: history of cocaine abouse quit 1997  . Sexual activity:  Not on file  Other Topics Concern  . Not on file  Social History Narrative  . Not on file    Review of Systems: See HPI, otherwise negative ROS  Physical Exam: BP 100/67   Pulse 97   Temp (!) 96.7 F (35.9 C) (Tympanic)   Resp 16   Ht 5' 9.5" (1.765 m)   Wt 115.7 kg (255 lb)   SpO2 100%   BMI 37.12 kg/m  General:   Alert,  pleasant and cooperative in NAD Head:  Normocephalic and atraumatic. Neck:  Supple; no masses or thyromegaly. Lungs:  Clear throughout to auscultation.    Heart:  Regular rate and rhythm. Abdomen:  Soft, nontender and nondistended. Normal bowel sounds, without guarding, and without rebound.   Neurologic:  Alert and  oriented x4;  grossly normal neurologically.  Impression/Plan: Peter Mccann is here for an endoscopy and colonoscopy  to be performed for colon cancer screening and generalized abd pain.  Risks, benefits, limitations, and alternatives regarding  endoscopy and colonoscopy have been reviewed with the patient.  Questions have been answered.  All parties agreeable.   Lynnae Prude, MD  07/23/2017, 12:53 PM

## 2017-07-23 NOTE — Anesthesia Preprocedure Evaluation (Signed)
Anesthesia Evaluation  Patient identified by MRN, date of birth, ID band Patient awake    Reviewed: Allergy & Precautions, NPO status , Patient's Chart, lab work & pertinent test results, reviewed documented beta blocker date and time   Airway Mallampati: III  TM Distance: >3 FB     Dental  (+) Poor Dentition   Pulmonary neg pulmonary ROS,    Pulmonary exam normal        Cardiovascular hypertension, Pt. on medications and Pt. on home beta blockers Normal cardiovascular exam     Neuro/Psych PSYCHIATRIC DISORDERS Depression negative neurological ROS     GI/Hepatic Neg liver ROS, GERD  ,  Endo/Other  diabetes, Well Controlled, Type 2, Oral Hypoglycemic Agents  Renal/GU negative Renal ROS  negative genitourinary   Musculoskeletal   Abdominal Normal abdominal exam  (+)   Peds negative pediatric ROS (+)  Hematology  (+) anemia ,   Anesthesia Other Findings   Reproductive/Obstetrics                             Anesthesia Physical Anesthesia Plan  ASA: III  Anesthesia Plan: General   Post-op Pain Management:    Induction: Intravenous  PONV Risk Score and Plan: TIVA  Airway Management Planned:   Additional Equipment:   Intra-op Plan:   Post-operative Plan:   Informed Consent: I have reviewed the patients History and Physical, chart, labs and discussed the procedure including the risks, benefits and alternatives for the proposed anesthesia with the patient or authorized representative who has indicated his/her understanding and acceptance.   Dental advisory given  Plan Discussed with: CRNA and Surgeon  Anesthesia Plan Comments:         Anesthesia Quick Evaluation

## 2017-07-23 NOTE — Op Note (Signed)
[facility] Gastroenterology Patient Name: Peter Mccann Procedure Date: 07/23/2017 12:59 PM MRN: [id number] Account #: [id number] Date of Birth: 07/17/1955 Admit Type: Outpatient Age: 62 [facility] 3 Gender: Male Note Status: Finalized Procedure:            Colonoscopy Indications:          Screening in patient at increased risk: Family history                        of 1st-degree relative with colorectal cancer Providers:            Scot Junobert T. Kortney Schoenfelder, MD Referring MD:         Daniel NonesBert Klein, MD (Referring MD) Medicines:            Propofol per Anesthesia Complications:        No immediate complications. Procedure:            Pre-Anesthesia Assessment:                       - After reviewing the risks and benefits, the patient                        was deemed in satisfactory condition to undergo the                        procedure.                       After obtaining informed consent, the colonoscope was                        passed under direct vision. Throughout the procedure,                        the patient's blood pressure, pulse, and oxygen                        saturations were monitored continuously. The                        Colonoscope was introduced through the anus and                        advanced to the the cecum, identified by appendiceal                        orifice and ileocecal valve. The colonoscopy was                        performed without difficulty. The patient tolerated the                        procedure well. The quality of the bowel preparation                        was excellent. Findings:      Internal hemorrhoids were found during endoscopy. The hemorrhoids were       small and Grade I (internal hemorrhoids that do not prolapse).      The exam was otherwise without abnormality. Impression:           -  Internal hemorrhoids.                       - The examination was otherwise normal.                       - No  specimens collected. Recommendation:       - Repeat colonoscopy in 5 years for screening purposes. Procedure Code(s):    --- Professional ---                       [phone], Colonoscopy, flexible; diagnostic, including                        collection of specimen(s) by brushing or washing, when                        performed (separate procedure) CPT copyright 2016 American Medical Association. All rights reserved. The codes documented in this report are preliminary and upon coder review may  be revised to meet current compliance requirements. Scot Jun, MD 07/23/2017 1:27:28 PM This report has been signed electronically. Number of Addenda: 0 Note Initiated On: 07/23/2017 12:59 PM Scope Withdrawal Time: 0 hours 5 minutes 34 seconds  Total Procedure Duration: 0 hours 8 minutes 56 seconds       [facility]

## 2017-07-23 NOTE — Anesthesia Post-op Follow-up Note (Signed)
Anesthesia QCDR form completed.        

## 2017-07-23 NOTE — Transfer of Care (Signed)
Immediate Anesthesia Transfer of Care Note  Patient: Peter CookeyLarry Dean Mccann  Procedure(s) Performed: ESOPHAGOGASTRODUODENOSCOPY (EGD) WITH PROPOFOL (N/A ) COLONOSCOPY WITH PROPOFOL (N/A )  Patient Location: PACU and Endoscopy Unit  Anesthesia Type:General  Level of Consciousness: awake, alert , oriented and patient cooperative  Airway & Oxygen Therapy: Patient Spontanous Breathing and Patient connected to nasal cannula oxygen  Post-op Assessment: Report given to RN and Post -op Vital signs reviewed and stable  Post vital signs: Reviewed and stable  Last Vitals:  Vitals:   07/23/17 1204  BP: 100/67  Pulse: 97  Resp: 16  Temp: (!) 35.9 C  SpO2: 100%    Last Pain:  Vitals:   07/23/17 1204  TempSrc: Tympanic         Complications: No apparent anesthesia complications

## 2017-07-23 NOTE — Anesthesia Postprocedure Evaluation (Signed)
Anesthesia Post Note  Patient: Peter CookeyLarry Dean Mccann  Procedure(s) Performed: ESOPHAGOGASTRODUODENOSCOPY (EGD) WITH PROPOFOL (N/A ) COLONOSCOPY WITH PROPOFOL (N/A )  Patient location during evaluation: PACU Anesthesia Type: General Level of consciousness: awake and alert and oriented Pain management: pain level controlled Vital Signs Assessment: post-procedure vital signs reviewed and stable Respiratory status: spontaneous breathing Cardiovascular status: blood pressure returned to baseline Anesthetic complications: no     Last Vitals:  Vitals:   07/23/17 1329 07/23/17 1339  BP: (!) 146/115 (!) 146/115  Pulse: 80 92  Resp: 19 20  Temp: (!) 36.4 C   SpO2: 96% 94%    Last Pain:  Vitals:   07/23/17 1329  TempSrc: Tympanic                 Shaquitta Burbridge

## 2017-07-24 ENCOUNTER — Encounter: Payer: Self-pay | Admitting: Unknown Physician Specialty

## 2017-07-24 LAB — SURGICAL PATHOLOGY

## 2018-05-20 ENCOUNTER — Other Ambulatory Visit: Payer: Self-pay

## 2018-05-20 ENCOUNTER — Encounter: Payer: Self-pay | Admitting: Internal Medicine

## 2018-05-20 ENCOUNTER — Observation Stay
Admission: EM | Admit: 2018-05-20 | Discharge: 2018-05-21 | Disposition: A | Discharge status: Home / Self Care | Payer: PRIVATE HEALTH INSURANCE | Attending: Internal Medicine | Admitting: Internal Medicine

## 2018-05-20 ENCOUNTER — Emergency Department: Payer: PRIVATE HEALTH INSURANCE

## 2018-05-20 DIAGNOSIS — E119 Type 2 diabetes mellitus without complications: Secondary | ICD-10-CM | POA: Insufficient documentation

## 2018-05-20 DIAGNOSIS — Z79899 Other long term (current) drug therapy: Secondary | ICD-10-CM | POA: Insufficient documentation

## 2018-05-20 DIAGNOSIS — E8729 Other acidosis: Secondary | ICD-10-CM

## 2018-05-20 DIAGNOSIS — E876 Hypokalemia: Secondary | ICD-10-CM | POA: Diagnosis not present

## 2018-05-20 DIAGNOSIS — E872 Acidosis: Principal | ICD-10-CM | POA: Insufficient documentation

## 2018-05-20 DIAGNOSIS — I10 Essential (primary) hypertension: Secondary | ICD-10-CM | POA: Insufficient documentation

## 2018-05-20 DIAGNOSIS — F329 Major depressive disorder, single episode, unspecified: Secondary | ICD-10-CM | POA: Diagnosis not present

## 2018-05-20 DIAGNOSIS — F10230 Alcohol dependence with withdrawal, uncomplicated: Secondary | ICD-10-CM | POA: Diagnosis present

## 2018-05-20 DIAGNOSIS — E8889 Other specified metabolic disorders: Secondary | ICD-10-CM | POA: Diagnosis present

## 2018-05-20 LAB — ACETAMINOPHEN LEVEL

## 2018-05-20 LAB — COMPREHENSIVE METABOLIC PANEL
ALK PHOS: 75 U/L (ref 38–126)
ALT: 35 U/L (ref 0–44)
AST: 48 U/L — ABNORMAL HIGH (ref 15–41)
Albumin: 4.3 g/dL (ref 3.5–5.0)
Anion gap: 22 — ABNORMAL HIGH (ref 5–15)
BUN: 18 mg/dL (ref 8–23)
CALCIUM: 8.7 mg/dL — AB (ref 8.9–10.3)
CHLORIDE: 96 mmol/L — AB (ref 98–111)
CO2: 19 mmol/L — AB (ref 22–32)
Creatinine, Ser: 0.86 mg/dL (ref 0.61–1.24)
Glucose, Bld: 199 mg/dL — ABNORMAL HIGH (ref 70–99)
Potassium: 3.8 mmol/L (ref 3.5–5.1)
SODIUM: 137 mmol/L (ref 135–145)
TOTAL PROTEIN: 8.1 g/dL (ref 6.5–8.1)
Total Bilirubin: 1.4 mg/dL — ABNORMAL HIGH (ref 0.3–1.2)

## 2018-05-20 LAB — CBC WITH DIFFERENTIAL/PLATELET
ABS IMMATURE GRANULOCYTES: 0.06 10*3/uL (ref 0.00–0.07)
BASOS ABS: 0.1 10*3/uL (ref 0.0–0.1)
Basophils Relative: 1 %
EOS ABS: 0 10*3/uL (ref 0.0–0.5)
Eosinophils Relative: 0 %
HEMATOCRIT: 41.5 % (ref 39.0–52.0)
HEMOGLOBIN: 14.4 g/dL (ref 13.0–17.0)
IMMATURE GRANULOCYTES: 1 %
LYMPHS PCT: 24 %
Lymphs Abs: 3.1 10*3/uL (ref 0.7–4.0)
MCH: 31.6 pg (ref 26.0–34.0)
MCHC: 34.7 g/dL (ref 30.0–36.0)
MCV: 91.2 fL (ref 80.0–100.0)
MONOS PCT: 6 %
Monocytes Absolute: 0.8 10*3/uL (ref 0.1–1.0)
Neutro Abs: 9 10*3/uL — ABNORMAL HIGH (ref 1.7–7.7)
Neutrophils Relative %: 68 %
Platelets: 221 10*3/uL (ref 150–400)
RBC: 4.55 MIL/uL (ref 4.22–5.81)
RDW: 14.4 % (ref 11.5–15.5)
WBC: 13 10*3/uL — ABNORMAL HIGH (ref 4.0–10.5)
nRBC: 0 % (ref 0.0–0.2)

## 2018-05-20 LAB — URINALYSIS, COMPLETE (UACMP) WITH MICROSCOPIC
BACTERIA UA: NONE SEEN
Bilirubin Urine: NEGATIVE
Glucose, UA: >=500 mg/dL — AB
Hgb urine dipstick: NEGATIVE
Ketones, ur: 20 mg/dL — AB
Leukocytes, UA: NEGATIVE
NITRITE: NEGATIVE
Protein, ur: 100 mg/dL — AB
Specific Gravity, Urine: 1.02 (ref 1.005–1.030)
pH: 5 (ref 5.0–8.0)

## 2018-05-20 LAB — GLUCOSE, CAPILLARY
Glucose-Capillary: 224 mg/dL — ABNORMAL HIGH (ref 70–99)
Glucose-Capillary: 179 mg/dL — ABNORMAL HIGH (ref 70–99)

## 2018-05-20 LAB — HEMOGLOBIN A1C
Hgb A1c MFr Bld: 7.8 % — ABNORMAL HIGH (ref 4.8–5.6)
Mean Plasma Glucose: 177.16 mg/dL

## 2018-05-20 LAB — SALICYLATE LEVEL: Salicylate Lvl: <7 mg/dL (ref 2.8–30.0)

## 2018-05-20 LAB — PROTIME-INR
INR: 1.1
Prothrombin Time: 14.1 seconds (ref 11.4–15.2)

## 2018-05-20 LAB — ETHANOL: Alcohol, Ethyl (B): 216 mg/dL — ABNORMAL HIGH (ref <10)

## 2018-05-20 MED ORDER — CHLORDIAZEPOXIDE HCL 10 MG PO CAPS
10.0000 mg | ORAL_CAPSULE | Freq: Three times a day (TID) | ORAL | Status: DC
Start: 2018-05-20 — End: 2018-05-21
  Administered 2018-05-20 – 2018-05-21 (×2): 10 mg via ORAL
  Filled 2018-05-20 (×2): qty 1

## 2018-05-20 MED ORDER — ONDANSETRON HCL 4 MG/2ML IJ SOLN: 4.0000 mg | Freq: Four times a day (QID) | INTRAMUSCULAR | Status: DC | PRN

## 2018-05-20 MED ORDER — SODIUM CHLORIDE 0.9% FLUSH: 3.0000 mL | Freq: Two times a day (BID) | INTRAVENOUS | Status: DC

## 2018-05-20 MED ORDER — SODIUM CHLORIDE 0.9 % IV BOLUS: 500.0000 mL | Freq: Once | INTRAVENOUS | Status: DC

## 2018-05-20 MED ORDER — INSULIN ASPART 100 UNIT/ML ~~LOC~~ SOLN: 0.0000 [IU] | Freq: Every day | SUBCUTANEOUS | Status: DC

## 2018-05-20 MED ORDER — ACETAMINOPHEN 650 MG RE SUPP: 650.0000 mg | Freq: Four times a day (QID) | RECTAL | Status: DC | PRN

## 2018-05-20 MED ORDER — FOLIC ACID 1 MG PO TABS
1.0000 mg | ORAL_TABLET | Freq: Every day | ORAL | Status: DC
  Administered 2018-05-21: 1 mg via ORAL
  Filled 2018-05-20: qty 1

## 2018-05-20 MED ORDER — ACETAMINOPHEN 325 MG PO TABS: 650.0000 mg | ORAL_TABLET | Freq: Four times a day (QID) | ORAL | Status: DC | PRN

## 2018-05-20 MED ORDER — THIAMINE HCL 100 MG/ML IJ SOLN
100.0000 mg | Freq: Once | INTRAMUSCULAR | Status: AC
  Administered 2018-05-20: 100 mg via INTRAVENOUS
  Filled 2018-05-20: qty 2

## 2018-05-20 MED ORDER — TELMISARTAN-HCTZ 80-25 MG PO TABS: 1.0000 | ORAL_TABLET | Freq: Every day | ORAL | Status: DC

## 2018-05-20 MED ORDER — VITAMIN B-1 100 MG PO TABS
100.0000 mg | ORAL_TABLET | Freq: Every day | ORAL | Status: DC
  Administered 2018-05-21: 100 mg via ORAL
  Filled 2018-05-20: qty 1

## 2018-05-20 MED ORDER — LORAZEPAM 1 MG PO TABS
1.0000 mg | ORAL_TABLET | Freq: Four times a day (QID) | ORAL | Status: DC | PRN
  Administered 2018-05-20: 1 mg via ORAL
  Filled 2018-05-20: qty 1

## 2018-05-20 MED ORDER — LORAZEPAM 2 MG/ML IJ SOLN
2.0000 mg | Freq: Once | INTRAMUSCULAR | Status: AC
  Administered 2018-05-20: 2 mg via INTRAVENOUS
  Filled 2018-05-20: qty 1

## 2018-05-20 MED ORDER — IRBESARTAN 150 MG PO TABS
300.0000 mg | ORAL_TABLET | Freq: Every day | ORAL | Status: DC
  Administered 2018-05-21: 300 mg via ORAL
  Filled 2018-05-20: qty 2

## 2018-05-20 MED ORDER — ENOXAPARIN SODIUM 40 MG/0.4ML ~~LOC~~ SOLN: 40.0000 mg | SUBCUTANEOUS | Status: DC

## 2018-05-20 MED ORDER — ALBUTEROL SULFATE (2.5 MG/3ML) 0.083% IN NEBU: 2.5000 mg | INHALATION_SOLUTION | RESPIRATORY_TRACT | Status: DC | PRN

## 2018-05-20 MED ORDER — ADULT MULTIVITAMIN W/MINERALS CH
1.0000 | ORAL_TABLET | Freq: Every day | ORAL | Status: DC
  Administered 2018-05-20 – 2018-05-21 (×2): 1 via ORAL
  Filled 2018-05-20 (×2): qty 1

## 2018-05-20 MED ORDER — PAROXETINE HCL 20 MG PO TABS
20.0000 mg | ORAL_TABLET | Freq: Two times a day (BID) | ORAL | Status: DC
  Administered 2018-05-20 – 2018-05-21 (×2): 20 mg via ORAL
  Filled 2018-05-20 (×3): qty 1

## 2018-05-20 MED ORDER — PANTOPRAZOLE SODIUM 40 MG PO TBEC
40.0000 mg | DELAYED_RELEASE_TABLET | Freq: Two times a day (BID) | ORAL | Status: DC
  Administered 2018-05-20 – 2018-05-21 (×2): 40 mg via ORAL
  Filled 2018-05-20 (×2): qty 1

## 2018-05-20 MED ORDER — INSULIN ASPART 100 UNIT/ML ~~LOC~~ SOLN
0.0000 [IU] | Freq: Three times a day (TID) | SUBCUTANEOUS | Status: DC
  Administered 2018-05-20 – 2018-05-21 (×2): 5 [IU] via SUBCUTANEOUS
  Administered 2018-05-21: 3 [IU] via SUBCUTANEOUS
  Filled 2018-05-20 (×2): qty 1

## 2018-05-20 MED ORDER — MAGNESIUM OXIDE 400 (241.3 MG) MG PO TABS
400.0000 mg | ORAL_TABLET | Freq: Every day | ORAL | Status: DC
  Administered 2018-05-21: 400 mg via ORAL
  Filled 2018-05-20: qty 1

## 2018-05-20 MED ORDER — ENOXAPARIN SODIUM 40 MG/0.4ML ~~LOC~~ SOLN
40.0000 mg | SUBCUTANEOUS | Status: DC
  Administered 2018-05-20: 40 mg via SUBCUTANEOUS
  Filled 2018-05-20: qty 0.4

## 2018-05-20 MED ORDER — CARVEDILOL 12.5 MG PO TABS
12.5000 mg | ORAL_TABLET | Freq: Two times a day (BID) | ORAL | Status: DC
  Administered 2018-05-20 – 2018-05-21 (×2): 12.5 mg via ORAL
  Filled 2018-05-20 (×2): qty 1

## 2018-05-20 MED ORDER — FOLIC ACID 5 MG/ML IJ SOLN
1.0000 mg | Freq: Every day | INTRAMUSCULAR | Status: DC
  Administered 2018-05-20: 1 mg via INTRAVENOUS
  Filled 2018-05-20 (×2): qty 0.2

## 2018-05-20 MED ORDER — LORAZEPAM 2 MG/ML IJ SOLN: 1.0000 mg | Freq: Four times a day (QID) | INTRAMUSCULAR | Status: DC | PRN

## 2018-05-20 MED ORDER — LACTATED RINGERS IV SOLN
INTRAVENOUS | Status: AC
  Administered 2018-05-20 – 2018-05-21 (×2): via INTRAVENOUS

## 2018-05-20 MED ORDER — ONDANSETRON HCL 4 MG PO TABS: 4.0000 mg | ORAL_TABLET | Freq: Four times a day (QID) | ORAL | Status: DC | PRN

## 2018-05-20 MED ORDER — SODIUM CHLORIDE 0.9 % IV BOLUS
1000.0000 mL | Freq: Once | INTRAVENOUS | Status: AC
  Administered 2018-05-20: 1000 mL via INTRAVENOUS

## 2018-05-20 MED ORDER — GLIPIZIDE ER 5 MG PO TB24
5.0000 mg | ORAL_TABLET | Freq: Every day | ORAL | Status: DC
  Administered 2018-05-21: 5 mg via ORAL
  Filled 2018-05-20: qty 1

## 2018-05-20 MED ORDER — CHLORDIAZEPOXIDE HCL 25 MG PO CAPS
50.0000 mg | ORAL_CAPSULE | Freq: Once | ORAL | Status: AC
  Administered 2018-05-20: 50 mg via ORAL
  Filled 2018-05-20: qty 2

## 2018-05-20 MED ORDER — AMLODIPINE BESYLATE 10 MG PO TABS
10.0000 mg | ORAL_TABLET | Freq: Every day | ORAL | Status: DC
  Administered 2018-05-20 – 2018-05-21 (×2): 10 mg via ORAL
  Filled 2018-05-20 (×2): qty 1

## 2018-05-20 MED ORDER — CHLORDIAZEPOXIDE HCL 10 MG PO CAPS: 10.0000 mg | ORAL_CAPSULE | Freq: Three times a day (TID) | ORAL | Status: DC

## 2018-05-20 MED ORDER — ATORVASTATIN CALCIUM 20 MG PO TABS
80.0000 mg | ORAL_TABLET | Freq: Every day | ORAL | Status: DC
  Administered 2018-05-20: 80 mg via ORAL
  Filled 2018-05-20: qty 4

## 2018-05-20 MED ORDER — THIAMINE HCL 100 MG/ML IJ SOLN: 100.0000 mg | Freq: Every day | INTRAMUSCULAR | Status: DC

## 2018-05-20 MED ORDER — HYDROCHLOROTHIAZIDE 25 MG PO TABS
25.0000 mg | ORAL_TABLET | Freq: Every day | ORAL | Status: DC
  Administered 2018-05-21: 25 mg via ORAL
  Filled 2018-05-20: qty 1

## 2018-05-20 MED ORDER — POTASSIUM CHLORIDE CRYS ER 10 MEQ PO TBCR
10.0000 meq | EXTENDED_RELEASE_TABLET | Freq: Every day | ORAL | Status: DC
  Administered 2018-05-21: 10 meq via ORAL
  Filled 2018-05-20: qty 1

## 2018-05-20 MED ORDER — DEXTROSE-NACL 5-0.45 % IV SOLN
Freq: Once | INTRAVENOUS | Status: AC
  Administered 2018-05-20: 10:00:00 via INTRAVENOUS

## 2018-05-20 MED ORDER — ONDANSETRON HCL 4 MG/2ML IJ SOLN
4.0000 mg | Freq: Once | INTRAMUSCULAR | Status: AC
  Administered 2018-05-20: 4 mg via INTRAVENOUS
  Filled 2018-05-20: qty 2

## 2018-05-20 MED ORDER — SUCRALFATE 1 G PO TABS
1.0000 g | ORAL_TABLET | Freq: Four times a day (QID) | ORAL | Status: DC
  Administered 2018-05-20 – 2018-05-21 (×3): 1 g via ORAL
  Filled 2018-05-20 (×3): qty 1

## 2018-05-20 MED ORDER — POLYETHYLENE GLYCOL 3350 17 G PO PACK: 17.0000 g | PACK | Freq: Every day | ORAL | Status: DC | PRN

## 2018-05-20 MED ORDER — TRAZODONE HCL 100 MG PO TABS
100.0000 mg | ORAL_TABLET | Freq: Every day | ORAL | Status: DC
  Administered 2018-05-20: 100 mg via ORAL
  Filled 2018-05-20: qty 1

## 2018-05-20 NOTE — Progress Notes (Signed)
Pt requesting sleep medications. Primary nurse paged and spoke to Dr. Katheren ShamsSalary. Orders received for Trazadone 100 mg q HS. Primary nurse to continue to monitor.

## 2018-05-20 NOTE — H&P (Signed)
[facility] [place] [facility]   PATIENT NAME: Peter Mccann Mutch    MR#:  [id number]  DATE OF BIRTH:  10/24/1955  DATE OF ADMISSION:  05/20/2018  PRIMARY CARE PHYSICIAN: Lynnea FerrierKlein, Bert J III, MD   REQUESTING/REFERRING PHYSICIAN: Dr. Scotty CourtStafford  CHIEF COMPLAINT:   Chief Complaint  Patient presents with  . Alcohol Intoxication    HISTORY OF PRESENT ILLNESS:  Peter Mccann Burkley  is a 63 y.o. male with a known history of diabetes, hypertension, alcohol abuse presents to the hospital brought in by his son as patient has been drinking continuously throughout the day and night over the past few days.  He tells me he used to drink a pint a day after work for 20 years.  Presently in the emergency room on arrival his alcohol level was 207.  Presently he is going into withdrawals.  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  Being admitted.  He complains of nausea but no vomiting.  No abdominal pain or diarrhea.  Afebrile. PAST MEDICAL HISTORY:   Past Medical History:  Diagnosis Date  . Abdominal pain   . Alcohol abuse   . Anemia   . B12 deficiency   . BMI 40.0-44.9, adult (HCC)   . Depression   . Diabetes (HCC)   . High cholesterol   . Hypertension   . Plaque psoriasis   . Substance abuse (HCC)   . Suicide (HCC)    when using drugs    PAST SURGICAL HISTORY:   Past Surgical History:  Procedure Laterality Date  . ANKLE SURGERY Right   . COLONOSCOPY WITH PROPOFOL N/A 07/23/2017   Procedure: COLONOSCOPY WITH PROPOFOL;  Surgeon: Scot JunElliott, Robert T, MD;  [facility];  Service: Endoscopy;  Laterality: N/A;  . ESOPHAGOGASTRODUODENOSCOPY (EGD) WITH PROPOFOL N/A 07/23/2017   Procedure: ESOPHAGOGASTRODUODENOSCOPY (EGD) WITH PROPOFOL;  Surgeon: Scot JunElliott, Robert T, MD;  [facility];  Service: Endoscopy;  Laterality: N/A;    SOCIAL HISTORY:   Social History   Tobacco Use  . Smoking status: Never Smoker  . Smokeless tobacco: Current User    Types: Snuff  Substance Use  Topics  . Alcohol use: Yes    Comment: heavy, chronic pint of liquor a day    FAMILY HISTORY:  No family history on file.  Father - colon cancer  DRUG ALLERGIES:  No Known Allergies  REVIEW OF SYSTEMS:   Review of Systems  Constitutional: Positive for malaise/fatigue. Negative for chills and fever.  HENT: Negative for sore throat.   Eyes: Negative for blurred vision, double vision and pain.  Respiratory: Negative for cough, hemoptysis, shortness of breath and wheezing.   Cardiovascular: Negative for chest pain, palpitations, orthopnea and leg swelling.  Gastrointestinal: Positive for nausea. Negative for abdominal pain, constipation, diarrhea, heartburn and vomiting.  Genitourinary: Negative for dysuria and hematuria.  Musculoskeletal: Negative for back pain and joint pain.  Skin: Negative for rash.  Neurological: Negative for sensory change, speech change, focal weakness and headaches.  Endo/Heme/Allergies: Does not bruise/bleed easily.  Psychiatric/Behavioral: Negative for depression. The patient is nervous/anxious.     MEDICATIONS AT HOME:   Prior to Admission medications   Medication Sig Start Date End Date Taking? Authorizing Provider  atorvastatin (LIPITOR) 80 MG tablet Take 80 mg by mouth daily. 06/19/16  Yes [provider]  carvedilol (COREG) 25 MG tablet Take 12.5 mg by mouth 2 (two) times daily. 05/30/16  Yes [provider]  PARoxetine (PAXIL) 20 MG tablet TAKE THREE TABLETS BY MOUTH  DAILY Patient taking differently: Take 20 mg by mouth 2 (two) times daily.  03/27/16  Yes Doles-Johnson, Teah, NP  potassium chloride (K-DUR) 10 MEQ tablet Take 10 mEq by mouth daily. 04/15/18  Yes [provider]  amLODipine (NORVASC) 10 MG tablet Take 10 mg by mouth daily. 07/23/16   [provider]  glipiZIDE (GLUCOTROL XL) 5 MG 24 hr tablet Take 5 mg by mouth daily. 07/03/16   [provider]  magnesium 30 MG tablet Take 30 mg by mouth 2  (two) times daily.    [provider]  magnesium oxide (MAG-OX) 400 MG tablet Take 400 mg by mouth daily.    [provider]  pantoprazole (PROTONIX) 40 MG tablet Take 40 mg by mouth 2 (two) times daily.    [provider]  sucralfate (CARAFATE) 1 g tablet Take 1 g by mouth 4 (four) times daily.    [provider]  telmisartan-hydrochlorothiazide (MICARDIS HCT) 80-25 MG per tablet Take 1 tablet by mouth daily. 02/01/13   [provider]     VITAL SIGNS:  Blood pressure (!) 142/75, pulse 91, temperature 98.4 F (36.9 C), temperature source Oral, resp. rate (!) 22, height 5\' 9"  (1.753 m), weight 117.9 kg, SpO2 92 %.  PHYSICAL EXAMINATION:  Physical Exam  GENERAL:  63 y.o.-year-old patient lying in the bed , tremulous EYES: Pupils equal, round, reactive to light and accommodation. No scleral icterus. Extraocular muscles intact.  HEENT: Head atraumatic, normocephalic. Oropharynx and nasopharynx clear. No oropharyngeal erythema, moist oral mucosa  NECK:  Supple, no jugular venous distention. No thyroid enlargement, no tenderness.  LUNGS: Normal breath sounds bilaterally, no wheezing, rales, rhonchi. No use of accessory muscles of respiration.  CARDIOVASCULAR: S1, S2 normal. No murmurs, rubs, or gallops.  ABDOMEN: Soft, nontender, nondistended. Bowel sounds present. No organomegaly or mass.  EXTREMITIES: No pedal edema, cyanosis, or clubbing. + 2 pedal & radial pulses b/l.   NEUROLOGIC: Cranial nerves II through XII are intact. No focal Motor or sensory deficits appreciated b/l. Tremors present PSYCHIATRIC: The patient is alert and oriented x 3. Good affect.  SKIN: No obvious rash, lesion, or ulcer.   LABORATORY PANEL:   CBC Recent Labs  Lab 05/20/18 0800  WBC 13.0*  HGB 14.4  HCT 41.5  PLT 221   ------------------------------------------------------------------------------------------------------------------  Chemistries  Recent Labs   Lab 05/20/18 0800  NA 137  K 3.8  CL 96*  CO2 19*  GLUCOSE 199*  BUN 18  CREATININE 0.86  CALCIUM 8.7*  AST 48*  ALT 35  ALKPHOS 75  BILITOT 1.4*   ------------------------------------------------------------------------------------------------------------------  Cardiac Enzymes No results for input(s): TROPONINI in the last 168 hours. ------------------------------------------------------------------------------------------------------------------  RADIOLOGY:  No results found.   IMPRESSION AND PLAN:   *Alcoholic ketosis.  Should improve with IV fluids.  We will bolus normal saline now.  Continue maintenance fluids.  Repeat labs in the morning.  *Alcohol withdrawals.  Patient has not had alcohol for close to 1 16 hours.  His alcohol level today morning was 200.  He presently is going into withdrawals and will be placed on scheduled Librium along with Ativan PRN and CIWA protocol.  *Diabetes mellitus.  Sliding scale insulin.  Diabetic diet.  *Hypertension.  Continue home medications  DVT prophylaxis with Lovenox  All the records are reviewed and case discussed with ED provider. Management plans discussed with the patient, family and they are in agreement.  CODE STATUS: FULL CODE  TOTAL TIME TAKING CARE OF THIS  PATIENT: 35 minutes.   Molinda Bailiff Ariya Bohannon M.D on 05/20/2018 at 2:07 PM  Between 7am to 6pm - Pager - [phone]  After 6pm go to [email or web address] - password [email or web address] [facility]  [facility]  Office  [phone]  CC: Primary care physician; Lynnea Ferrier, MD  Note: This dictation was prepared with Dragon dictation along with smaller phrase technology. Any transcriptional errors that result from this process are unintentional.

## 2018-05-20 NOTE — ED Triage Notes (Signed)
Pt assisted to wheelchair upon arrival; pt says he's been a daily drinker "all my life"; has been drinking heavily the last 4-5 days; pt says his son wants him to go for detox, pt says he thinks he just needs some fluids; pt dry heaving; pt was demanding something to drink before he got registered and said he'd leave if he didn't get something; pt understands he's here voluntarily and can leave at any time he chooses;

## 2018-05-20 NOTE — ED Provider Notes (Addendum)
[facility] Emergency Department Provider Note  ____________________________________________  Time seen: Approximately 8:03 AM  I have reviewed the triage vital signs and the nursing notes.   HISTORY  Chief Complaint Alcohol Intoxication    HPI Peter Mccann is a 62 y.o. male with a history of alcohol abuse diabetes and hypertension who complains of feeling dehydrated, wanting IV fluids and hoping to be discharged home after.  Reports that he has been a daily drinker for many many years, predominantly in the evenings but over the last 10 days he has been drinking heavily throughout the entire day from the time he wakes up in the morning until bedtime.  States that he has been under a lot of extra stress lately, particularly related to his children and their own social problems and substance use problems.  He denies any history of liver failure.  Denies any history of withdrawal symptoms.  He denies any current shaking flushing sweating hallucinations or seizure episodes.  Last drink was 6 PM yesterday.  He reports that he is not interested in any form of detox program currently.  No SI HI or hallucinations.      Past Medical History:  Diagnosis Date  . Abdominal pain   . Alcohol abuse   . Anemia   . B12 deficiency   . BMI 40.0-44.9, adult (HCC)   . Depression   . Diabetes (HCC)   . High cholesterol   . Hypertension   . Plaque psoriasis   . Substance abuse (HCC)   . Suicide (HCC)    when using drugs     There are no active problems to display for this patient.    Past Surgical History:  Procedure Laterality Date  . ANKLE SURGERY Right   . COLONOSCOPY WITH PROPOFOL N/A 07/23/2017   Procedure: COLONOSCOPY WITH PROPOFOL;  Surgeon: Scot Jun, MD;  Location: [facility] ENDOSCOPY;  Service: Endoscopy;  Laterality: N/A;  . ESOPHAGOGASTRODUODENOSCOPY (EGD) WITH PROPOFOL N/A 07/23/2017   Procedure: ESOPHAGOGASTRODUODENOSCOPY (EGD) WITH PROPOFOL;   Surgeon: Scot Jun, MD;  Location: [facility] ENDOSCOPY;  Service: Endoscopy;  Laterality: N/A;     Prior to Admission medications   Medication Sig Start Date End Date Taking? Authorizing Provider  atorvastatin (LIPITOR) 80 MG tablet Take 80 mg by mouth daily. 06/19/16  Yes [provider]  carvedilol (COREG) 25 MG tablet Take 12.5 mg by mouth 2 (two) times daily. 05/30/16  Yes [provider]  PARoxetine (PAXIL) 20 MG tablet TAKE THREE TABLETS BY MOUTH DAILY Patient taking differently: Take 20 mg by mouth 2 (two) times daily.  03/27/16  Yes Doles-Johnson, Teah, NP  potassium chloride (K-DUR) 10 MEQ tablet Take 10 mEq by mouth daily. 04/15/18  Yes [provider]  amLODipine (NORVASC) 10 MG tablet Take 10 mg by mouth daily. 07/23/16   [provider]  glipiZIDE (GLUCOTROL XL) 5 MG 24 hr tablet Take 5 mg by mouth daily. 07/03/16   [provider]  magnesium 30 MG tablet Take 30 mg by mouth 2 (two) times daily.    [provider]  magnesium oxide (MAG-OX) 400 MG tablet Take 400 mg by mouth daily.    [provider]  pantoprazole (PROTONIX) 40 MG tablet Take 40 mg by mouth 2 (two) times daily.    [provider]  sucralfate (CARAFATE) 1 g tablet Take 1 g by mouth 4 (four) times daily.    [provider]  telmisartan-hydrochlorothiazide (MICARDIS HCT) 80-25 MG per tablet  Take 1 tablet by mouth daily. 02/01/13   [provider]     Allergies Patient has no known allergies.   No family history on file.  Social History Social History   Tobacco Use  . Smoking status: Never Smoker  . Smokeless tobacco: Current User    Types: Snuff  Substance Use Topics  . Alcohol use: Yes    Comment: heavy, chronic pint of liquor a day  . Drug use: No    Comment: history of cocaine abouse quit 1997    Review of Systems  Constitutional:   No fever or chills.  ENT:   No sore throat. No rhinorrhea. Cardiovascular:    No chest pain or syncope. Respiratory:   No dyspnea or cough. Gastrointestinal:   Negative for abdominal pain, vomiting and diarrhea.  Musculoskeletal:   Negative for focal pain or swelling All other systems reviewed and are negative except as documented above in ROS and HPI.  ____________________________________________   PHYSICAL EXAM:  VITAL SIGNS: ED Triage Vitals [05/20/18 0710]  Enc Vitals Group     BP 122/80     Pulse Rate (!) 146     Resp 20     Temp 98.4 F (36.9 C)     Temp Source Oral     SpO2 96 %     Weight 260 lb (117.9 kg)     Height 5\' 9"  (1.753 m)     Head Circumference      Peak Flow      Pain Score      Pain Loc      Pain Edu?      Excl. in GC?     Vital signs reviewed, nursing assessments reviewed.   Constitutional:   Alert and oriented. Non-toxic appearance. Eyes:   Conjunctivae are normal. EOMI. PERRL. ENT      Head:   Normocephalic and atraumatic.      Nose:   No congestion/rhinnorhea.       Mouth/Throat:   Dry mucous membranes, no pharyngeal erythema. No peritonsillar mass.       Neck:   No meningismus. Full ROM. Hematological/Lymphatic/Immunilogical:   No cervical lymphadenopathy. Cardiovascular:   Tachycardia heart rate 140. Symmetric bilateral radial and DP pulses.  No murmurs. Cap refill less than 2 seconds. Respiratory:   Normal respiratory effort without tachypnea/retractions. Breath sounds are clear and equal bilaterally. No wheezes/rales/rhonchi. Gastrointestinal:   Obese, soft and nontender. Non distended. There is no CVA tenderness.  No rebound, rigidity, or guarding.  No fluid wave Musculoskeletal:   Normal range of motion in all extremities. No joint effusions.  No lower extremity tenderness.  No edema. Neurologic:   Normal speech and language.  Motor grossly intact. No acute focal neurologic deficits are appreciated.  Skin:    Skin is warm, dry and intact. No rash noted.  No petechiae, purpura, or  bullae.  ____________________________________________    LABS (pertinent positives/negatives) (all labs ordered are listed, but only abnormal results are displayed) Labs Reviewed  COMPREHENSIVE METABOLIC PANEL - Abnormal; Notable for the following components:      Result Value   Chloride 96 (*)    CO2 19 (*)    Glucose, Bld 199 (*)    Calcium 8.7 (*)    AST 48 (*)    Total Bilirubin 1.4 (*)    Anion gap 22 (*)    All other components within normal limits  CBC WITH DIFFERENTIAL/PLATELET - Abnormal; Notable for the following components:  WBC 13.0 (*)    Neutro Abs 9.0 (*)    All other components within normal limits  ACETAMINOPHEN LEVEL - Abnormal; Notable for the following components:   Acetaminophen (Tylenol), Serum <10 (*)    All other components within normal limits  ETHANOL - Abnormal; Notable for the following components:   Alcohol, Ethyl (B) 216 (*)    All other components within normal limits  URINALYSIS, COMPLETE (UACMP) WITH MICROSCOPIC - Abnormal; Notable for the following components:   Color, Urine YELLOW (*)    APPearance CLEAR (*)    Glucose, UA >=500 (*)    Ketones, ur 20 (*)    Protein, ur 100 (*)    All other components within normal limits  PROTIME-INR  SALICYLATE LEVEL   ____________________________________________   EKG  Interpreted by me Atrial fibrillation, rate of 136.  Normal axis and intervals.  Normal QRS ST segments and T waves.  ____________________________________________    RADIOLOGY  No results found.  ____________________________________________   PROCEDURES Procedures  ____________________________________________    CLINICAL IMPRESSION / ASSESSMENT AND PLAN / ED COURSE  Pertinent labs & imaging results that were available during my care of the patient were reviewed by me and considered in my medical decision making (see chart for details).    Patient presents with tachycardia in the setting of heavy alcohol use.   Likely dehydration possibly ketoacidosis.  Will check labs and give IV fluids for hydration.  Patient is tolerating oral intake, nontoxic, lucid.  Sober at this time.  No evidence of significant withdrawal syndrome at this time although he does feel a little bit restless and I will give him some Ativan while waiting for labs.  Clinical Course as of May 21 1215  Mon May 20, 2018  0903 Labs show high anion gap metabolic acidosis, likely alcoholic ketoacidosis.  Continue IV fluids with D5 half-normal saline.  Thiamine and folate supplementation.  Check urinalysis and chest x-ray as well given his slight leukocytosis.   [PS]  [phone] Patient refuses chest x-ray.  States he does feel better after IV fluids.  I discussed his abnormal lab findings with him and recommended continued IV fluid and lab recheck prior to making a disposition decision.  Patient strongly prefers to go home, does not want to stop drinking.  EKG does show atrial fibrillation possibly new, likely holiday heart syndrome.  Heart rate has improved to 104 with hydration.  Patient is chronically on rate control agents but reports he has not taken them in the last 4 days.   [PS]  1215 Patient now tremulous, flushed, and mildly diaphoretic, worrisome for withdrawal syndrome despite ethanol level still being about 200.  Discussed this with the patient, he indicates that he does not want to start drinking again.  With his ketoacidosis, advised him that we should hospitalize him to control withdrawal syndromes, hydrate, and correct his metabolic acidosis, to which he agrees.   [PS]    Clinical Course User Index [PS] Sharman CheekStafford, Shanoah Asbill, MD     ____________________________________________   FINAL CLINICAL IMPRESSION(S) / ED DIAGNOSES    Final diagnoses:  Alcoholic ketoacidosis  Alcohol dependence with uncomplicated withdrawal (HCC)  Metabolic acidosis with high anion gap   ED Discharge Orders    None      Portions of this note were  generated with dragon dictation software. Dictation errors may occur despite best attempts at proofreading.   Sharman CheekStafford, Annastasia Haskins, MD 05/20/18 1217    Sharman CheekStafford, Nature Vogelsang, MD 05/20/18 [phone]

## 2018-05-20 NOTE — Progress Notes (Signed)
Advance care planning  Purpose of Encounter Alcohol abuse  Parties in Attendance Patient  Patients Decisional capacity Alert and oriented.  Able to make medical decisions.  No documented healthcare power of attorney or advanced directives in place  Is regarding alcohol abuse and need for admission with ketosis.  Prognosis and treatment plan discussed.  Patient would like to be a full code.   FULL CODE orders entered  Time spent - 17 minutes

## 2018-05-21 LAB — CBC
HCT: 33.6 % — ABNORMAL LOW (ref 39.0–52.0)
Hemoglobin: 11.4 g/dL — ABNORMAL LOW (ref 13.0–17.0)
MCH: 31.3 pg (ref 26.0–34.0)
MCHC: 33.9 g/dL (ref 30.0–36.0)
MCV: 92.3 fL (ref 80.0–100.0)
Platelets: 128 10*3/uL — ABNORMAL LOW (ref 150–400)
RBC: 3.64 MIL/uL — AB (ref 4.22–5.81)
RDW: 14.2 % (ref 11.5–15.5)
WBC: 6.2 10*3/uL (ref 4.0–10.5)
nRBC: 0 % (ref 0.0–0.2)

## 2018-05-21 LAB — BASIC METABOLIC PANEL
Anion gap: 10 (ref 5–15)
BUN: 13 mg/dL (ref 8–23)
CO2: 28 mmol/L (ref 22–32)
Calcium: 8.6 mg/dL — ABNORMAL LOW (ref 8.9–10.3)
Chloride: 99 mmol/L (ref 98–111)
Creatinine, Ser: 0.65 mg/dL (ref 0.61–1.24)
GFR calc Af Amer: >60 mL/min (ref >60)
Glucose, Bld: 177 mg/dL — ABNORMAL HIGH (ref 70–99)
Potassium: 2.9 mmol/L — ABNORMAL LOW (ref 3.5–5.1)
Sodium: 137 mmol/L (ref 135–145)

## 2018-05-21 LAB — HIV ANTIBODY (ROUTINE TESTING W REFLEX): HIV Screen 4th Generation wRfx: NONREACTIVE

## 2018-05-21 LAB — GLUCOSE, CAPILLARY
Glucose-Capillary: 196 mg/dL — ABNORMAL HIGH (ref 70–99)
Glucose-Capillary: 234 mg/dL — ABNORMAL HIGH (ref 70–99)

## 2018-05-21 LAB — MAGNESIUM: Magnesium: 1.7 mg/dL (ref 1.7–2.4)

## 2018-05-21 LAB — PHOSPHORUS: PHOSPHORUS: 1.8 mg/dL — AB (ref 2.5–4.6)

## 2018-05-21 MED ORDER — POTASSIUM CHLORIDE CRYS ER 20 MEQ PO TBCR
40.0000 meq | EXTENDED_RELEASE_TABLET | Freq: Once | ORAL | Status: AC
  Administered 2018-05-21: 40 meq via ORAL
  Filled 2018-05-21: qty 2

## 2018-05-21 MED ORDER — POTASSIUM PHOSPHATES 15 MMOLE/5ML IV SOLN
30.0000 mmol | Freq: Once | INTRAVENOUS | Status: AC
  Administered 2018-05-21: 30 mmol via INTRAVENOUS
  Filled 2018-05-21: qty 10

## 2018-05-21 MED ORDER — CHLORDIAZEPOXIDE HCL 5 MG PO CAPS: 5.0000 mg | ORAL_CAPSULE | Freq: Three times a day (TID) | ORAL | 0 refills | Status: DC

## 2018-05-21 NOTE — Progress Notes (Signed)
Discharge order received. Patient is alert and oriented. Vital signs stable . No signs of acute distress. Discharge instructions given. Patient verbalized understanding. No other issues noted at this time.   

## 2018-05-21 NOTE — Discharge Summary (Addendum)
[facility] [place] [facility]   PATIENT NAME: Peter Mccann    MR#:  [id number]  DATE OF BIRTH:  03/12/1956  DATE OF ADMISSION:  05/20/2018 ADMITTING PHYSICIAN: Milagros Loll, MD  DATE OF DISCHARGE: 05/21/2018  PRIMARY CARE PHYSICIAN: Lynnea Ferrier, MD    ADMISSION DIAGNOSIS:  Alcoholic ketoacidosis [E87.2] Alcohol dependence with uncomplicated withdrawal (HCC) [F10.230]  DISCHARGE DIAGNOSIS:  Active Problems:   Alcoholic ketosis   SECONDARY DIAGNOSIS:   Past Medical History:  Diagnosis Date  . Abdominal pain   . Alcohol abuse   . Anemia   . B12 deficiency   . BMI 40.0-44.9, adult (HCC)   . Depression   . Diabetes (HCC)   . High cholesterol   . Hypertension   . Plaque psoriasis   . Substance abuse (HCC)   . Suicide (HCC)    when using drugs    HOSPITAL COURSE:   63 year old male with history of hypertension and alcohol abuse who was brought to the emergency room due to binge drinking.   1.  Ketosis due to EtOH: This is improved with IV fluids.  2.  EtOH abuse: Patient was counseled.  Patient was started on Librium taper for withdrawal.  Patient will continue Librium taper as directed.  Patient reports that he will not drink alcohol.  Son is at bedside who will watch him carefully.  3.  Hypertension: Patient will continue Coreg and Norvasc, Micardis  4.  Diabetes: Continue ADA diet with glipizide  5.  Hypokalemia: This was repleted prior to discharge  DISCHARGE CONDITIONS AND DIET:   Stable for discharge on diabetic diet  CONSULTS OBTAINED:    DRUG ALLERGIES:  No Known Allergies  DISCHARGE MEDICATIONS:   Allergies as of 05/21/2018   No Known Allergies     Medication List    TAKE these medications   amLODipine 10 MG tablet Commonly known as:  NORVASC Take 10 mg by mouth daily.   atorvastatin 80 MG tablet Commonly known as:  LIPITOR Take 80 mg by mouth daily.   carvedilol 25 MG tablet Commonly known as:  COREG Take  12.5 mg by mouth 2 (two) times daily.   chlordiazePOXIDE 5 MG capsule Commonly known as:  LIBRIUM Take 1 capsule (5 mg total) by mouth 3 (three) times daily. Take 10 mg three times a day for 2 days then 5 mg three times a day for 2 days then 5 mg twice a day for 2 days then 5 mg daily for 2 days then STOP   glipiZIDE 5 MG 24 hr tablet Commonly known as:  GLUCOTROL XL Take 5 mg by mouth daily.   magnesium 30 MG tablet Take 30 mg by mouth 2 (two) times daily.   magnesium oxide 400 MG tablet Commonly known as:  MAG-OX Take 400 mg by mouth daily.   pantoprazole 40 MG tablet Commonly known as:  PROTONIX Take 40 mg by mouth 2 (two) times daily.   PARoxetine 20 MG tablet Commonly known as:  PAXIL TAKE THREE TABLETS BY MOUTH DAILY What changed:    how much to take  when to take this   potassium chloride 10 MEQ tablet Commonly known as:  K-DUR Take 10 mEq by mouth daily.   sucralfate 1 g tablet Commonly known as:  CARAFATE Take 1 g by mouth 4 (four) times daily.   telmisartan-hydrochlorothiazide 80-25 MG tablet Commonly known as:  MICARDIS HCT Take 1 tablet by mouth daily.  Today   CHIEF COMPLAINT:  Patient is feeling better this morning.  No signs of alcohol withdrawal   VITAL SIGNS:  Blood pressure 125/71, pulse (!) 59, temperature 98 F (36.7 C), temperature source Oral, resp. rate 20, height 5\' 9"  (1.753 m), weight 118 kg, SpO2 94 %.   REVIEW OF SYSTEMS:  Review of Systems  Constitutional: Negative.  Negative for chills, fever and malaise/fatigue.  HENT: Negative.  Negative for ear discharge, ear pain, hearing loss, nosebleeds and sore throat.   Eyes: Negative.  Negative for blurred vision and pain.  Respiratory: Negative.  Negative for cough, hemoptysis, shortness of breath and wheezing.   Cardiovascular: Negative.  Negative for chest pain, palpitations and leg swelling.  Gastrointestinal: Negative.  Negative for abdominal pain, blood in stool,  diarrhea, nausea and vomiting.  Genitourinary: Negative.  Negative for dysuria.  Musculoskeletal: Negative.  Negative for back pain.  Skin: Negative.   Neurological: Negative for dizziness, tremors, speech change, focal weakness, seizures and headaches.  Endo/Heme/Allergies: Negative.  Does not bruise/bleed easily.  Psychiatric/Behavioral: Negative.  Negative for depression, hallucinations and suicidal ideas.     PHYSICAL EXAMINATION:  GENERAL:  63 y.o.-year-old patient lying in the bed with no acute distress.  NECK:  Supple, no jugular venous distention. No thyroid enlargement, no tenderness.  LUNGS: Normal breath sounds bilaterally, no wheezing, rales,rhonchi  No use of accessory muscles of respiration.  CARDIOVASCULAR: S1, S2 normal. No murmurs, rubs, or gallops.  ABDOMEN: Soft, non-tender, non-distended. Bowel sounds present. No organomegaly or mass.  EXTREMITIES: No pedal edema, cyanosis, or clubbing.  PSYCHIATRIC: The patient is alert and oriented x 3.  SKIN: No obvious rash, lesion, or ulcer.   DATA REVIEW:   CBC Recent Labs  Lab 05/21/18 0254  WBC 6.2  HGB 11.4*  HCT 33.6*  PLT 128*    Chemistries  Recent Labs  Lab 05/20/18 0800 05/21/18 0254  NA 137 137  K 3.8 2.9*  CL 96* 99  CO2 19* 28  GLUCOSE 199* 177*  BUN 18 13  CREATININE 0.86 0.65  CALCIUM 8.7* 8.6*  MG  --  1.7  AST 48*  --   ALT 35  --   ALKPHOS 75  --   BILITOT 1.4*  --     Cardiac Enzymes No results for input(s): TROPONINI in the last 168 hours.  Microbiology Results  @MICRORSLT48 @  RADIOLOGY:  No results found.    Allergies as of 05/21/2018   No Known Allergies     Medication List    TAKE these medications   amLODipine 10 MG tablet Commonly known as:  NORVASC Take 10 mg by mouth daily.   atorvastatin 80 MG tablet Commonly known as:  LIPITOR Take 80 mg by mouth daily.   carvedilol 25 MG tablet Commonly known as:  COREG Take 12.5 mg by mouth 2 (two) times daily.    chlordiazePOXIDE 5 MG capsule Commonly known as:  LIBRIUM Take 1 capsule (5 mg total) by mouth 3 (three) times daily. Take 10 mg three times a day for 2 days then 5 mg three times a day for 2 days then 5 mg twice a day for 2 days then 5 mg daily for 2 days then STOP   glipiZIDE 5 MG 24 hr tablet Commonly known as:  GLUCOTROL XL Take 5 mg by mouth daily.   magnesium 30 MG tablet Take 30 mg by mouth 2 (two) times daily.   magnesium oxide 400 MG tablet Commonly known as:  MAG-OX Take 400 mg by mouth daily.   pantoprazole 40 MG tablet Commonly known as:  PROTONIX Take 40 mg by mouth 2 (two) times daily.   PARoxetine 20 MG tablet Commonly known as:  PAXIL TAKE THREE TABLETS BY MOUTH DAILY What changed:    how much to take  when to take this   potassium chloride 10 MEQ tablet Commonly known as:  K-DUR Take 10 mEq by mouth daily.   sucralfate 1 g tablet Commonly known as:  CARAFATE Take 1 g by mouth 4 (four) times daily.   telmisartan-hydrochlorothiazide 80-25 MG tablet Commonly known as:  MICARDIS HCT Take 1 tablet by mouth daily.        Management plans discussed with the patient and son and they are in agreement. Stable for discharge home  Patient should follow up with pcp  CODE STATUS:     Code Status Orders  (From admission, onward)         Start     Ordered   05/20/18 1320  Full code  Continuous     05/20/18 1320        Code Status History    Date Active Date Inactive Code Status Order ID Comments User Context   02/22/2013 1609 02/23/2013 1525 Full Code [id number]  Roxy HorsemanBrowning, Robert, PA-C ED    Advance Directive Documentation     Most Recent Value  Type of Advance Directive  Healthcare Power of Attorney, Living will  Pre-existing out of facility DNR order (yellow form or pink MOST form)  -  "MOST" Form in Place?  -      TOTAL TIME TAKING CARE OF THIS PATIENT: 38 minutes.    Note: This dictation was prepared with Dragon dictation along with  smaller phrase technology. Any transcriptional errors that result from this process are unintentional.  Aribelle Mccosh M.D on 05/21/2018 at 11:36 AM  Between 7am to 6pm - Pager - [phone] After 6pm go to [email or web address] - password [email or web address]  [facility]  Office  [phone]  CC: Primary care physician; Lynnea FerrierKlein, Bert J III, MD

## 2018-05-21 NOTE — Consult Note (Signed)
Pharmacy Electrolyte Monitoring Consult:  Pharmacy consulted to assist in monitoring and replacing electrolytes in this 63 year-old male admitted with alcoholic ketosis.     Labs:   Potassium (mmol/L)  Date Value  05/21/2018 2.9 (L)  12/29/2012 3.5   Magnesium (mg/dL)  Date Value  36/04/2448 1.7   Phosphorus (mg/dL)  Date Value  75/30/0511 1.8 (L)   Calcium (mg/dL)  Date Value  06/25/1733 8.6 (L)   Calcium, Total (mg/dL)  Date Value  67/05/4101 7.8 (L)   Albumin (g/dL)  Date Value  06/14/4386 4.3  12/29/2012 3.3 (L)    Assessment/Plan: He is hypokalemic and hypophosphatemic. We will use 30 mmol of IV potassium phosphate which provides 44 mEq of potassium. He is on oral KCl daily PTA which will remain in place. We will add an additional order for oral KCl 40 mEq once. Pharmacy will follow up these electrolytes again tomorrow morning and replace as needed.  Thank you for allowing pharmacy to be a part of this patient's care.  Burnis Medin, PharmD Clinical Pharmacist

## 2018-05-21 NOTE — Discharge Instructions (Signed)
Alcohol Use Disorder  Alcohol use disorder is when your drinking disrupts your daily life. When you have this condition, you drink too much alcohol and you cannot control your drinking.  Alcohol use disorder can cause serious problems with your physical health. It can affect your brain, heart, liver, pancreas, immune system, stomach, and intestines. Alcohol use disorder can increase your risk for certain cancers and cause problems with your mental health, such as depression, anxiety, psychosis, delirium, and dementia. People with this disorder risk hurting themselves and others.  What are the causes?  This condition is caused by drinking too much alcohol over time. It is not caused by drinking too much alcohol only one or two times. Some people with this condition drink alcohol to cope with or escape from negative life events. Others drink to relieve pain or symptoms of mental illness.  What increases the risk?  You are more likely to develop this condition if:  · You have a family history of alcohol use disorder.  · Your culture encourages drinking to the point of intoxication, or makes alcohol easy to get.  · You had a mood or conduct disorder in childhood.  · You have been a victim of abuse.  · You are an adolescent and:  ? You have poor grades or difficulties in school.  ? Your caregivers do not talk to you about saying no to alcohol, or supervise your activities.  ? You are impulsive or you have trouble with self-control.  What are the signs or symptoms?  Symptoms of this condition include:  · Drinking more than you want to.  · Drinking for longer than you want to.  · Trying several times to drink less or to control your drinking.  · Spending a lot of time getting alcohol, drinking, or recovering from drinking.  · Craving alcohol.  · Having problems at work, at school, or at home due to drinking.  · Having problems in relationships due to drinking.  · Drinking when it is dangerous to drink, such as before  driving a car.  · Continuing to drink even though you know you might have a physical or mental problem related to drinking.  · Needing more and more alcohol to get the same effect you want from the alcohol (building up tolerance).  · Having symptoms of withdrawal when you stop drinking. Symptoms of withdrawal include:  ? Fatigue.  ? Nightmares.  ? Trouble sleeping.  ? Depression.  ? Anxiety.  ? Fever.  ? Seizures.  ? Severe confusion.  ? Feeling or seeing things that are not there (hallucinations).  ? Tremors.  ? Rapid heart rate.  ? Rapid breathing.  ? High blood pressure.  · Drinking to avoid symptoms of withdrawal.  How is this diagnosed?  This condition is diagnosed with an assessment. Your health care provider may start the assessment by asking three or four questions about your drinking.  Your health care provider may perform a physical exam or do lab tests to see if you have physical problems resulting from alcohol use. She or he may refer you to a mental health professional for evaluation.  How is this treated?  Some people with alcohol use disorder are able to reduce their alcohol use to low-risk levels. Others need to completely quit drinking alcohol. When necessary, mental health professionals with specialized training in substance use treatment can help. Your health care provider can help you decide how severe your alcohol use disorder is and what type   of treatment you need. The following forms of treatment are available:  · Detoxification. Detoxification involves quitting drinking and using prescription medicines within the first week to help lessen withdrawal symptoms. This treatment is important for people who have had withdrawal symptoms before and for heavy drinkers who are likely to have withdrawal symptoms. Alcohol withdrawal can be dangerous, and in severe cases, it can cause death. Detoxification may be provided in a home, community, or primary care setting, or in a hospital or substance use  treatment facility.  · Counseling. This treatment is also called talk therapy. It is provided by substance use treatment counselors. A counselor can address the reasons you use alcohol and suggest ways to keep you from drinking again or to prevent problem drinking. The goals of talk therapy are to:  ? Find healthy activities and ways for you to cope with stress.  ? Identify and avoid the things that trigger your alcohol use.  ? Help you learn how to handle cravings.  · Medicines. Medicines can help treat alcohol use disorder by:  ? Decreasing alcohol cravings.  ? Decreasing the positive feeling you have when you drink alcohol.  ? Causing an uncomfortable physical reaction when you drink alcohol (aversion therapy).  · Support groups. Support groups are led by people who have quit drinking. They provide emotional support, advice, and guidance.  These forms of treatment are often combined. Some people with this condition benefit from a combination of treatments provided by specialized substance use treatment centers.  Follow these instructions at home:  · Take over-the-counter and prescription medicines only as told by your health care provider.  · Check with your health care provider before starting any new medicines.  · Ask friends and family members not to offer you alcohol.  · Avoid situations where alcohol is served, including gatherings where others are drinking alcohol.  · Create a plan for what to do when you are tempted to use alcohol.  · Find hobbies or activities that you enjoy that do not include alcohol.  · Keep all follow-up visits as told by your health care provider. This is important.  How is this prevented?  · If you drink, limit alcohol intake to no more than 1 drink a day for nonpregnant women and 2 drinks a day for men. One drink equals 12 oz of beer, 5 oz of wine, or 1½ oz of hard liquor.  · If you have a mental health condition, get treatment and support.  · Do not give alcohol to  adolescents.  · If you are an adolescent:  ? Do not drink alcohol.  ? Do not be afraid to say no if someone offers you alcohol. Speak up about why you do not want to drink. You can be a positive role model for your friends and set a good example for those around you by not drinking alcohol.  ? If your friends drink, spend time with others who do not drink alcohol. Make new friends who do not use alcohol.  ? Find healthy ways to manage stress and emotions, such as meditation or deep breathing, exercise, spending time in nature, listening to music, or talking with a trusted friend or family member.  Contact a health care provider if:  · You are not able to take your medicines as told.  · Your symptoms get worse.  · You return to drinking alcohol (relapse) and your symptoms get worse.  Get help right away if:  · You have thoughts   about hurting yourself or others.  If you ever feel like you may hurt yourself or others, or have thoughts about taking your own life, get help right away. You can go to your nearest emergency department or call:  · Your local emergency services (911 in the [place]  · A suicide crisis helpline, such as the National Suicide Prevention Lifeline at 1-800-273-8255. This is open 24 hours a day.  Summary  · Alcohol use disorder is when your drinking disrupts your daily life. When you have this condition, you drink too much alcohol and you cannot control your drinking.  · Treatment may include detoxification, counseling, medicine, and support groups.  · Ask friends and family members not to offer you alcohol. Avoid situations where alcohol is served.  · Get help right away if you have thoughts about hurting yourself or others.  This information is not intended to replace advice given to you by your health care provider. Make sure you discuss any questions you have with your health care provider.  Document Released: 06/08/2004 Document Revised: 01/27/2016 Document Reviewed: 01/27/2016  Elsevier  Interactive Patient Education © 2019 [employer]

## 2018-05-27 ENCOUNTER — Telehealth: Payer: Self-pay

## 2018-05-27 NOTE — Telephone Encounter (Signed)
Flagged on EMMI report for not having a follow up scheduled.  Called and spoke with patient's wife as patient unavailable. Confirmed he has one scheduled for 06/03/2018.  No other questions or concerns at this time.

## 2018-08-26 ENCOUNTER — Encounter: Payer: Self-pay | Admitting: Intensive Care

## 2018-08-26 ENCOUNTER — Other Ambulatory Visit: Payer: Self-pay

## 2018-08-26 ENCOUNTER — Emergency Department: Payer: PRIVATE HEALTH INSURANCE

## 2018-08-26 ENCOUNTER — Emergency Department
Admission: EM | Admit: 2018-08-26 | Discharge: 2018-08-26 | Disposition: A | Discharge status: Home / Self Care | Payer: PRIVATE HEALTH INSURANCE | Attending: Emergency Medicine | Admitting: Emergency Medicine

## 2018-08-26 DIAGNOSIS — F10920 Alcohol use, unspecified with intoxication, uncomplicated: Secondary | ICD-10-CM | POA: Diagnosis not present

## 2018-08-26 DIAGNOSIS — I10 Essential (primary) hypertension: Secondary | ICD-10-CM | POA: Insufficient documentation

## 2018-08-26 DIAGNOSIS — S0181XA Laceration without foreign body of other part of head, initial encounter: Secondary | ICD-10-CM | POA: Diagnosis not present

## 2018-08-26 DIAGNOSIS — Y939 Activity, unspecified: Secondary | ICD-10-CM | POA: Insufficient documentation

## 2018-08-26 DIAGNOSIS — S0990XA Unspecified injury of head, initial encounter: Secondary | ICD-10-CM | POA: Diagnosis present

## 2018-08-26 DIAGNOSIS — E119 Type 2 diabetes mellitus without complications: Secondary | ICD-10-CM | POA: Diagnosis not present

## 2018-08-26 DIAGNOSIS — Y929 Unspecified place or not applicable: Secondary | ICD-10-CM | POA: Insufficient documentation

## 2018-08-26 DIAGNOSIS — S0031XA Abrasion of nose, initial encounter: Secondary | ICD-10-CM | POA: Insufficient documentation

## 2018-08-26 DIAGNOSIS — Y999 Unspecified external cause status: Secondary | ICD-10-CM | POA: Insufficient documentation

## 2018-08-26 DIAGNOSIS — X58XXXA Exposure to other specified factors, initial encounter: Secondary | ICD-10-CM | POA: Insufficient documentation

## 2018-08-26 NOTE — ED Notes (Signed)
PT ambulatory with sheriff deputy at time of discharge. PT in NAD. Pt unable to sign esignature at departure due to intoxication

## 2018-08-26 NOTE — ED Triage Notes (Signed)
Patient brought in by PD for medical clearance to go to jail. Patient intoxicated
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
## 2018-11-01 ENCOUNTER — Other Ambulatory Visit
Admission: RE | Admit: 2018-11-01 | Discharge: 2018-11-01 | Disposition: A | Discharge status: Home / Self Care | Payer: PRIVATE HEALTH INSURANCE | Source: Ambulatory Visit | Attending: Cardiology | Admitting: Cardiology

## 2018-11-01 ENCOUNTER — Other Ambulatory Visit: Payer: Self-pay

## 2018-11-01 DIAGNOSIS — Z1159 Encounter for screening for other viral diseases: Secondary | ICD-10-CM | POA: Insufficient documentation

## 2018-11-02 LAB — NOVEL CORONAVIRUS, NAA (HOSP ORDER, SEND-OUT TO REF LAB; TAT 18-24 HRS): SARS-CoV-2, NAA: NOT DETECTED

## 2018-11-06 ENCOUNTER — Other Ambulatory Visit: Payer: Self-pay

## 2018-11-06 ENCOUNTER — Encounter: Payer: Self-pay | Admitting: *Deleted

## 2018-11-06 ENCOUNTER — Encounter
Admission: RE | Disposition: A | Discharge status: Home / Self Care | Payer: Self-pay | Source: Home / Self Care | Attending: Cardiology

## 2018-11-06 ENCOUNTER — Observation Stay
Admission: RE | Admit: 2018-11-06 | Discharge: 2018-11-06 | Disposition: A | Discharge status: Home / Self Care | Payer: PRIVATE HEALTH INSURANCE | Attending: Cardiology | Admitting: Cardiology

## 2018-11-06 DIAGNOSIS — R9430 Abnormal result of cardiovascular function study, unspecified: Secondary | ICD-10-CM

## 2018-11-06 DIAGNOSIS — I2510 Atherosclerotic heart disease of native coronary artery without angina pectoris: Principal | ICD-10-CM | POA: Insufficient documentation

## 2018-11-06 DIAGNOSIS — Z7984 Long term (current) use of oral hypoglycemic drugs: Secondary | ICD-10-CM | POA: Insufficient documentation

## 2018-11-06 DIAGNOSIS — E119 Type 2 diabetes mellitus without complications: Secondary | ICD-10-CM | POA: Insufficient documentation

## 2018-11-06 DIAGNOSIS — Z7982 Long term (current) use of aspirin: Secondary | ICD-10-CM | POA: Insufficient documentation

## 2018-11-06 DIAGNOSIS — Z79899 Other long term (current) drug therapy: Secondary | ICD-10-CM | POA: Diagnosis not present

## 2018-11-06 DIAGNOSIS — I10 Essential (primary) hypertension: Secondary | ICD-10-CM | POA: Insufficient documentation

## 2018-11-06 DIAGNOSIS — E785 Hyperlipidemia, unspecified: Secondary | ICD-10-CM | POA: Insufficient documentation

## 2018-11-06 HISTORY — PX: CORONARY STENT INTERVENTION: CATH118234

## 2018-11-06 HISTORY — PX: LEFT HEART CATH AND CORONARY ANGIOGRAPHY: CATH118249

## 2018-11-06 LAB — POCT ACTIVATED CLOTTING TIME
Activated Clotting Time: 252 seconds
Activated Clotting Time: 279 seconds

## 2018-11-06 LAB — GLUCOSE, CAPILLARY: Glucose-Capillary: 131 mg/dL — ABNORMAL HIGH (ref 70–99)

## 2018-11-06 SURGERY — LEFT HEART CATH AND CORONARY ANGIOGRAPHY
Anesthesia: Moderate Sedation

## 2018-11-06 MED ORDER — ACETAMINOPHEN 325 MG PO TABS
650.0000 mg | ORAL_TABLET | ORAL | Status: DC | PRN
  Administered 2018-11-06: 650 mg via ORAL

## 2018-11-06 MED ORDER — HEPARIN SODIUM (PORCINE) 1000 UNIT/ML IJ SOLN
INTRAMUSCULAR | Status: AC
  Filled 2018-11-06: qty 1

## 2018-11-06 MED ORDER — AMLODIPINE BESYLATE 10 MG PO TABS: 10.0000 mg | ORAL_TABLET | Freq: Every day | ORAL | Status: DC

## 2018-11-06 MED ORDER — TRAZODONE HCL 50 MG PO TABS: 50.0000 mg | ORAL_TABLET | Freq: Every evening | ORAL | Status: DC | PRN

## 2018-11-06 MED ORDER — VERAPAMIL HCL 2.5 MG/ML IV SOLN
INTRAVENOUS | Status: AC
  Filled 2018-11-06: qty 2

## 2018-11-06 MED ORDER — VERAPAMIL HCL 2.5 MG/ML IV SOLN
INTRAVENOUS | Status: DC | PRN
  Administered 2018-11-06: 2.5 mg via INTRA_ARTERIAL

## 2018-11-06 MED ORDER — TICAGRELOR 90 MG PO TABS
ORAL_TABLET | ORAL | Status: DC | PRN
  Administered 2018-11-06: 180 mg via ORAL

## 2018-11-06 MED ORDER — SODIUM CHLORIDE 0.9% FLUSH: 3.0000 mL | Freq: Two times a day (BID) | INTRAVENOUS | Status: DC

## 2018-11-06 MED ORDER — SODIUM CHLORIDE 0.9 % WEIGHT BASED INFUSION
3.0000 mL/kg/h | INTRAVENOUS | Status: DC
  Administered 2018-11-06: 08:00:00 3 mL/kg/h via INTRAVENOUS

## 2018-11-06 MED ORDER — MAGNESIUM OXIDE 400 (241.3 MG) MG PO TABS: 400.0000 mg | ORAL_TABLET | Freq: Every day | ORAL | Status: DC

## 2018-11-06 MED ORDER — SODIUM CHLORIDE 0.9 % WEIGHT BASED INFUSION: 1.0000 mL/kg/h | INTRAVENOUS | Status: DC

## 2018-11-06 MED ORDER — ASPIRIN 81 MG PO CHEW: 81.0000 mg | CHEWABLE_TABLET | Freq: Every day | ORAL | Status: DC

## 2018-11-06 MED ORDER — SODIUM CHLORIDE 0.9 % IV SOLN: 250.0000 mL | INTRAVENOUS | Status: DC | PRN

## 2018-11-06 MED ORDER — SODIUM CHLORIDE 0.9% FLUSH: 3.0000 mL | INTRAVENOUS | Status: DC | PRN

## 2018-11-06 MED ORDER — GLIPIZIDE ER 10 MG PO TB24: 10.0000 mg | ORAL_TABLET | Freq: Every day | ORAL | Status: DC

## 2018-11-06 MED ORDER — ASPIRIN 81 MG PO CHEW
CHEWABLE_TABLET | ORAL | Status: AC
  Filled 2018-11-06: qty 1

## 2018-11-06 MED ORDER — MIDAZOLAM HCL 2 MG/2ML IJ SOLN
INTRAMUSCULAR | Status: AC
  Filled 2018-11-06: qty 2

## 2018-11-06 MED ORDER — ATORVASTATIN CALCIUM 80 MG PO TABS
80.0000 mg | ORAL_TABLET | Freq: Every day | ORAL | Status: DC
  Filled 2018-11-06: qty 1

## 2018-11-06 MED ORDER — MIDAZOLAM HCL 2 MG/2ML IJ SOLN
INTRAMUSCULAR | Status: DC | PRN
  Administered 2018-11-06 (×2): 1 mg via INTRAVENOUS

## 2018-11-06 MED ORDER — FENTANYL CITRATE (PF) 100 MCG/2ML IJ SOLN
INTRAMUSCULAR | Status: AC
  Filled 2018-11-06: qty 2

## 2018-11-06 MED ORDER — CARVEDILOL 25 MG PO TABS: 25.0000 mg | ORAL_TABLET | Freq: Two times a day (BID) | ORAL | Status: DC

## 2018-11-06 MED ORDER — TICAGRELOR 90 MG PO TABS
ORAL_TABLET | ORAL | Status: AC
  Filled 2018-11-06: qty 2

## 2018-11-06 MED ORDER — IOHEXOL 300 MG/ML  SOLN
INTRAMUSCULAR | Status: DC | PRN
  Administered 2018-11-06: 270 mL via INTRA_ARTERIAL

## 2018-11-06 MED ORDER — ASPIRIN 81 MG PO CHEW: 81.0000 mg | CHEWABLE_TABLET | ORAL | Status: AC

## 2018-11-06 MED ORDER — FENTANYL CITRATE (PF) 100 MCG/2ML IJ SOLN
INTRAMUSCULAR | Status: DC | PRN
  Administered 2018-11-06 (×3): 25 ug via INTRAVENOUS

## 2018-11-06 MED ORDER — POTASSIUM CHLORIDE CRYS ER 10 MEQ PO TBCR: 10.0000 meq | EXTENDED_RELEASE_TABLET | Freq: Every day | ORAL | Status: DC

## 2018-11-06 MED ORDER — HYDRALAZINE HCL 20 MG/ML IJ SOLN: 10.0000 mg | INTRAMUSCULAR | Status: AC | PRN

## 2018-11-06 MED ORDER — PREGABALIN 50 MG PO CAPS: 50.0000 mg | ORAL_CAPSULE | Freq: Two times a day (BID) | ORAL | Status: DC

## 2018-11-06 MED ORDER — LABETALOL HCL 5 MG/ML IV SOLN: 10.0000 mg | INTRAVENOUS | Status: AC | PRN

## 2018-11-06 MED ORDER — HEPARIN (PORCINE) IN NACL 1000-0.9 UT/500ML-% IV SOLN
INTRAVENOUS | Status: AC
  Filled 2018-11-06: qty 1000

## 2018-11-06 MED ORDER — ASPIRIN 81 MG PO CHEW: 81.0000 mg | CHEWABLE_TABLET | Freq: Every day | ORAL | 11 refills | Status: DC

## 2018-11-06 MED ORDER — ONDANSETRON HCL 4 MG/2ML IJ SOLN: 4.0000 mg | Freq: Four times a day (QID) | INTRAMUSCULAR | Status: DC | PRN

## 2018-11-06 MED ORDER — TICAGRELOR 90 MG PO TABS
90.0000 mg | ORAL_TABLET | Freq: Two times a day (BID) | ORAL | Status: DC
  Administered 2018-11-06: 90 mg via ORAL
  Filled 2018-11-06: qty 1

## 2018-11-06 MED ORDER — HYDROCHLOROTHIAZIDE 25 MG PO TABS: 25.0000 mg | ORAL_TABLET | Freq: Every day | ORAL | Status: DC

## 2018-11-06 MED ORDER — TICAGRELOR 90 MG PO TABS: 90.0000 mg | ORAL_TABLET | Freq: Two times a day (BID) | ORAL | 6 refills | Status: DC

## 2018-11-06 MED ORDER — ACETAMINOPHEN 325 MG PO TABS
ORAL_TABLET | ORAL | Status: AC
  Administered 2018-11-06: 11:00:00 650 mg via ORAL
  Filled 2018-11-06: qty 2

## 2018-11-06 MED ORDER — HEPARIN SODIUM (PORCINE) 1000 UNIT/ML IJ SOLN
INTRAMUSCULAR | Status: DC | PRN
  Administered 2018-11-06: 3000 [IU] via INTRAVENOUS
  Administered 2018-11-06: 5000 [IU] via INTRAVENOUS
  Administered 2018-11-06: 2000 [IU] via INTRAVENOUS
  Administered 2018-11-06: 5000 [IU] via INTRAVENOUS

## 2018-11-06 SURGICAL SUPPLY — 15 items
BALLN TREK RX 2.5X12 (BALLOONS) ×4
BALLOON TREK RX 2.5X12 (BALLOONS) ×2 IMPLANT
CATH INFINITI 5 FR JL3.5 (CATHETERS) ×4 IMPLANT
CATH INFINITI 5FR ANG PIGTAIL (CATHETERS) ×4 IMPLANT
CATH INFINITI JR4 5F (CATHETERS) ×4 IMPLANT
CATH VISTA GUIDE 6FR JL3.5 (CATHETERS) ×4 IMPLANT
CATH VISTA GUIDE 6FR XB3.5 (CATHETERS) ×4 IMPLANT
DEVICE INFLAT 30 PLUS (MISCELLANEOUS) ×4 IMPLANT
DEVICE RAD TR BAND REGULAR (VASCULAR PRODUCTS) ×4 IMPLANT
GLIDESHEATH SLEND SS 6F .021 (SHEATH) ×4 IMPLANT
KIT MANI 3VAL PERCEP (MISCELLANEOUS) ×4 IMPLANT
PACK CARDIAC CATH (CUSTOM PROCEDURE TRAY) ×4 IMPLANT
STENT RESOLUTE ONYX 2.75X12 (Permanent Stent) ×4 IMPLANT
WIRE ASAHI PROWATER 180CM (WIRE) ×4 IMPLANT
WIRE ROSEN-J .035X260CM (WIRE) ×4 IMPLANT

## 2018-11-06 NOTE — Discharge Summary (Signed)
Physician Discharge Summary  Patient ID: Peter CookeyLarry Dean Mccann MRN: [id number] DOB/AGE: 62/05/1955 62 y.o.  Admit date: 11/06/2018 Discharge date: 11/06/2018  Primary Discharge Diagnosis chest pain Secondary Discharge Diagnosis coronary disease  Significant Diagnostic Studies: yes  Consults: None  Hospital Course: The patient underwent elective cardiac catheterization earlier today which revealed 90% stenosis mid LAD.  The patient underwent successful DES via right radial approach.  The patient was transferred to telemetry where he was chest pain-free.  However, the patient is very adamant about going home not staying overnight.  Patient is clinically stable, relating without difficulty, right radial site well-healed.  Patient to be discharged early this evening in stable condition.  Felt to be more acceptable than keeping the patient here against his will overnight.  Instructed to be compliant with medications, especially aspirin and Brilinta.   Discharge Exam: Blood pressure 125/67, pulse 64, temperature 98.6 F (37 C), temperature source Oral, resp. rate 19, height 5\' 9"  (1.753 m), weight 120.3 kg, SpO2 98 %.   General appearance: alert Head: Normocephalic, without obvious abnormality, atraumatic Eyes: conjunctivae/corneas clear. PERRL, EOM's intact. Fundi benign. Ears: normal TM's and external ear canals both ears Nose: Nares normal. Septum midline. Mucosa normal. No drainage or sinus tenderness. Throat: lips, mucosa, and tongue normal; teeth and gums normal Neck: no adenopathy, no carotid bruit, no JVD, supple, symmetrical, trachea midline and thyroid not enlarged, symmetric, no tenderness/mass/nodules Back: symmetric, no curvature. ROM normal. No CVA tenderness. Resp: clear to auscultation bilaterally Chest wall: no tenderness Cardio: regular rate and rhythm, S1, S2 normal, no murmur, click, rub or gallop GI: soft, non-tender; bowel sounds normal; no masses,  no  organomegaly Extremities: extremities normal, atraumatic, no cyanosis or edema Pulses: 2+ and symmetric Skin: Skin color, texture, turgor normal. No rashes or lesions Lymph nodes: Cervical, supraclavicular, and axillary nodes normal. Neurologic: Grossly normal Incision/Wound: Well-healed without evidence for bleeding Labs:   Lab Results  Component Value Date   WBC 6.2 05/21/2018   HGB 11.4 (L) 05/21/2018   HCT 33.6 (L) 05/21/2018   MCV 92.3 05/21/2018   PLT 128 (L) 05/21/2018   No results for input(s): NA, K, CL, CO2, BUN, CREATININE, CALCIUM, PROT, BILITOT, ALKPHOS, ALT, AST, GLUCOSE in the last 168 hours.  Invalid input(s): LABALBU    Radiology:  EKG: Sinus rhythm  FOLLOW UP PLANS AND APPOINTMENTS Discharge Instructions    AMB Referral to Cardiac Rehabilitation - Phase II   Complete by: As directed    Diagnosis: Coronary Stents   After initial evaluation and assessments completed: Virtual Based Care may be provided alone or in conjunction with Phase 2 Cardiac Rehab based on patient barriers.: Yes     Allergies as of 11/06/2018      Reactions   Metformin And Related Other (See Comments)   Upset stomach      Medication List    STOP taking these medications   chlordiazePOXIDE 5 MG capsule Commonly known as: LIBRIUM   PARoxetine 20 MG tablet Commonly known as: PAXIL     TAKE these medications   amLODipine 10 MG tablet Commonly known as: NORVASC Take 10 mg by mouth daily.   aspirin 81 MG chewable tablet Chew 1 tablet (81 mg total) by mouth daily. Start taking on: November 07, 2018   atorvastatin 80 MG tablet Commonly known as: LIPITOR Take 80 mg by mouth daily.   B-12 3000 MCG Caps Take 3,000 mcg by mouth daily.   carvedilol 25 MG tablet Commonly known as:  COREG Take 12.5 mg by mouth 2 (two) times daily.   glipiZIDE 10 MG 24 hr tablet Commonly known as: GLUCOTROL XL Take 10 mg by mouth daily.   hydrochlorothiazide 25 MG tablet Commonly known as:  HYDRODIURIL Take 25 mg by mouth daily.   Magnesium 250 MG Tabs Take 250 mg by mouth daily.   naproxen sodium 220 MG tablet Commonly known as: ALEVE Take 440 mg by mouth daily as needed (pain).   pantoprazole 40 MG tablet Commonly known as: PROTONIX Take 40 mg by mouth 2 (two) times daily.   potassium chloride 10 MEQ tablet Commonly known as: K-DUR Take 10 mEq by mouth daily.   pregabalin 50 MG capsule Commonly known as: LYRICA Take 50 mg by mouth 2 (two) times daily.   pyridOXINE 100 MG tablet Commonly known as: VITAMIN B-6 Take 100 mg by mouth daily.   ticagrelor 90 MG Tabs tablet Commonly known as: BRILINTA Take 1 tablet (90 mg total) by mouth 2 (two) times daily.      Follow-up Information    Peter Joplin, MD Follow up in 1 week(s).   Specialty: Cardiology Contact information: [facility] [place] [address] [phone]           BRING ALL MEDICATIONS WITH YOU TO FOLLOW UP APPOINTMENTS  Time spent with patient to include physician time: 25 min Signed:  Isaias Cowman MD, PhD, [facility] 11/06/2018, 4:42 PM

## 2018-11-06 NOTE — OR Nursing (Signed)
Wife called by pt with update information about stent and pending admission (pt talked with wife)

## 2018-11-07 ENCOUNTER — Encounter: Payer: Self-pay | Admitting: Cardiology

## 2019-03-09 IMAGING — RF DG UGI W/ SMALL BOWEL
4 series · 15 of 17 positions shown · non-contrast
Comparison: Abdominopelvic CT scan August 26, 2009

CLINICAL DATA: Three month history of postprandial bloating and
early satiety. Increased gas. Increased weight.

EXAM:
UPPER GI SERIES WITH SMALL BOWEL FOLLOW-THROUGH using barium
FLUOROSCOPY TIME:  Fluoroscopy Time:  1 minutes, 54 seconds
Radiation Exposure Index (if provided by the fluoroscopic device):
0004 micro Gy per meters square
Number of Acquired Spot Images: 13
TECHNIQUE: Combined double contrast and single contrast upper GI series using
effervescent crystals, thick barium, and thin barium. Subsequently,
serial images of the small bowel were obtained including spot views
of the terminal ileum.

[Series 1: t abdomen supine · 0.14mm/px · 4 of 4 slices shown]
[im 1/4]
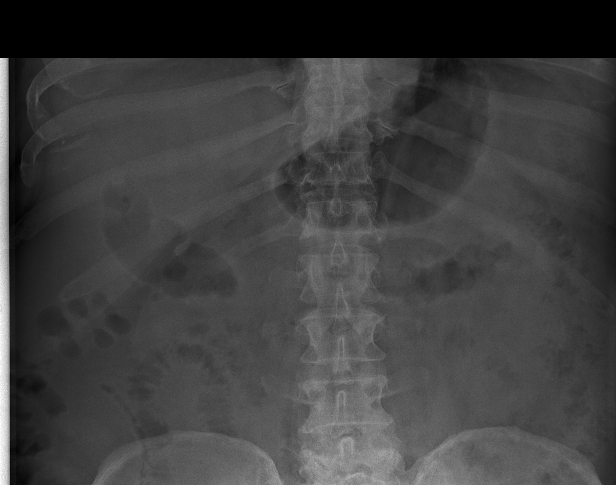
[im 2/4]
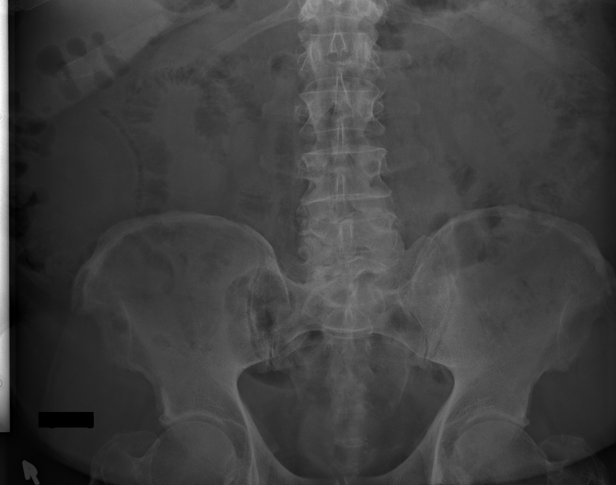
[im 3/4]
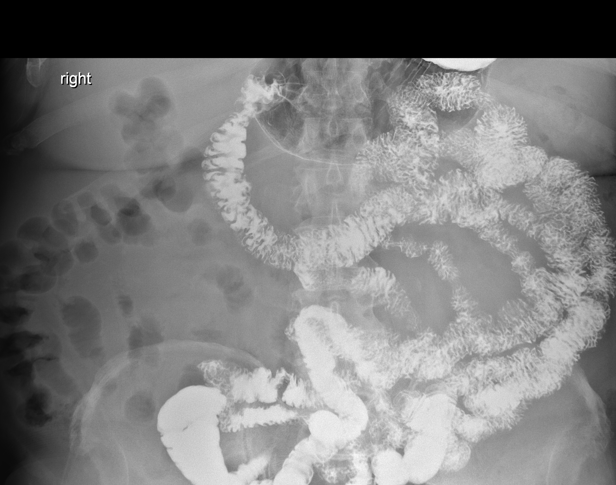
[im 4/4]
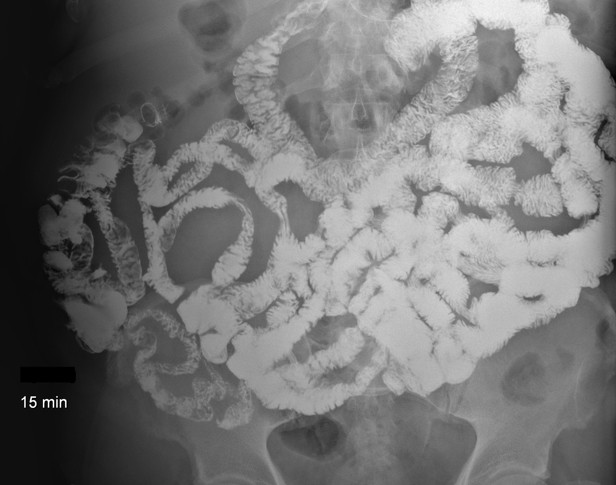

[Series 3: fluoro_barium 2fps_bw · 0.17mm/px · 9 of 11 slices shown (1 of 3)]
[im 2/11]
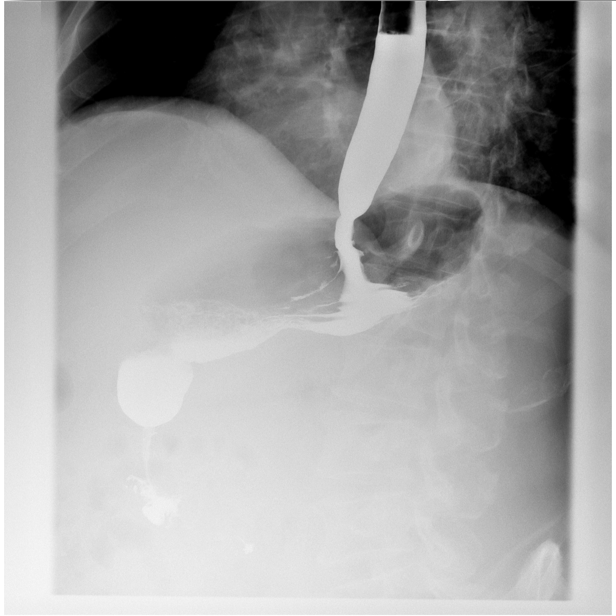
[im 3/11]
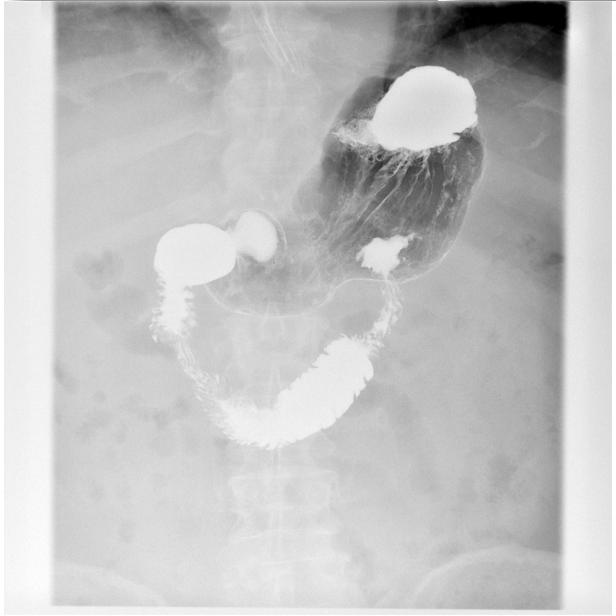
[im 4/11]
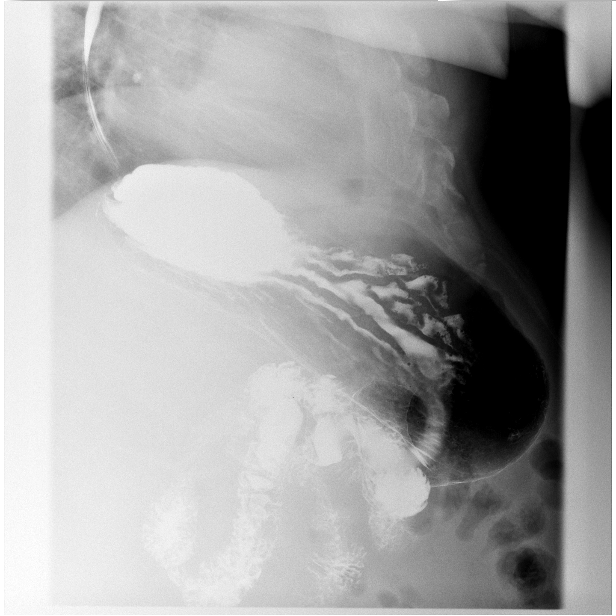
[im 5/11]
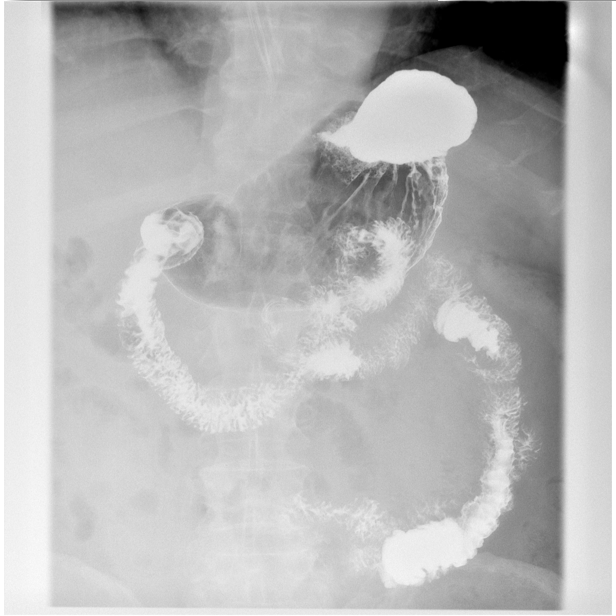
[im 6/11]
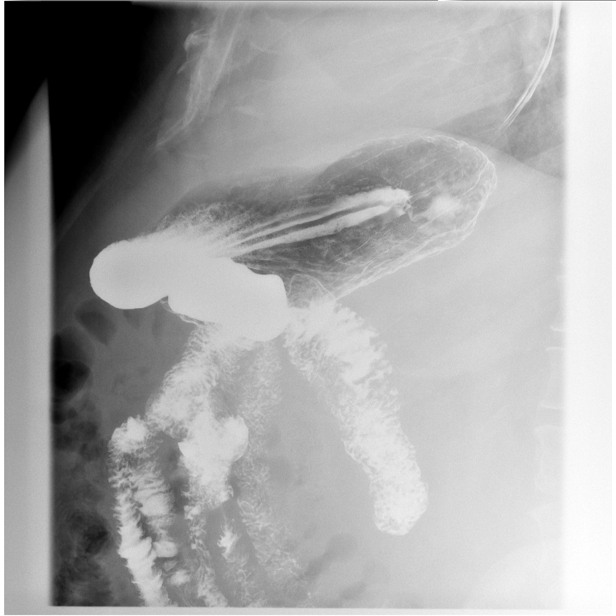
[im 7/11]
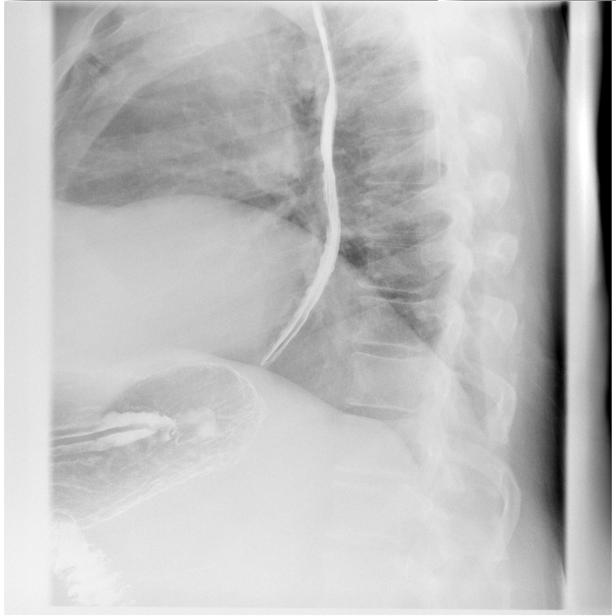
[im 8/11]
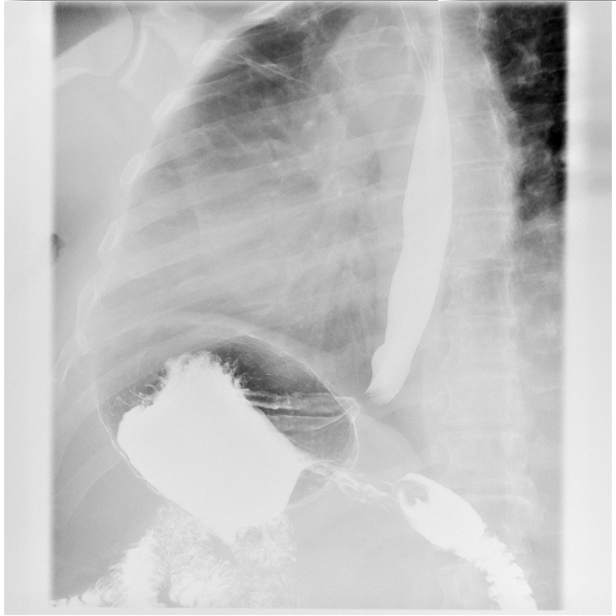
[im 10/11]
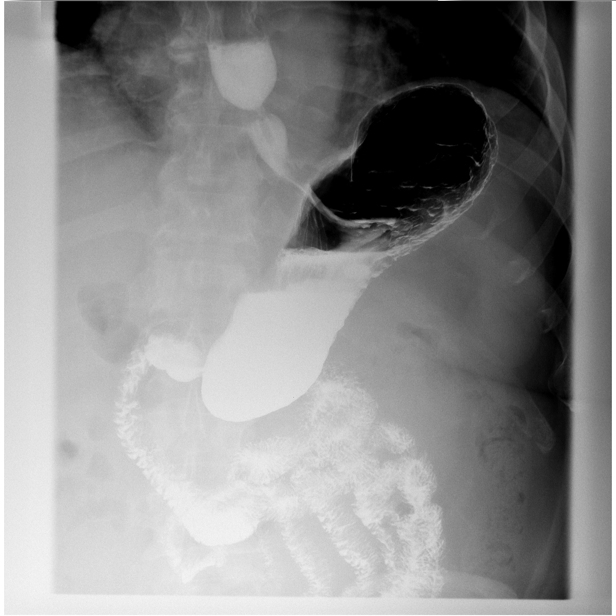
[im 11/11]
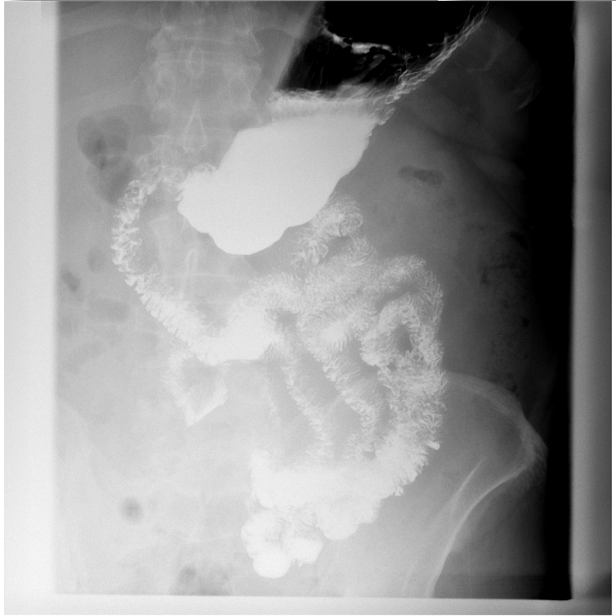

[Series 17: fluoro_barium 2fps_bw · 0.18mm/px · 1 of 1 slices shown (2 of 3)]
[im 1/1]
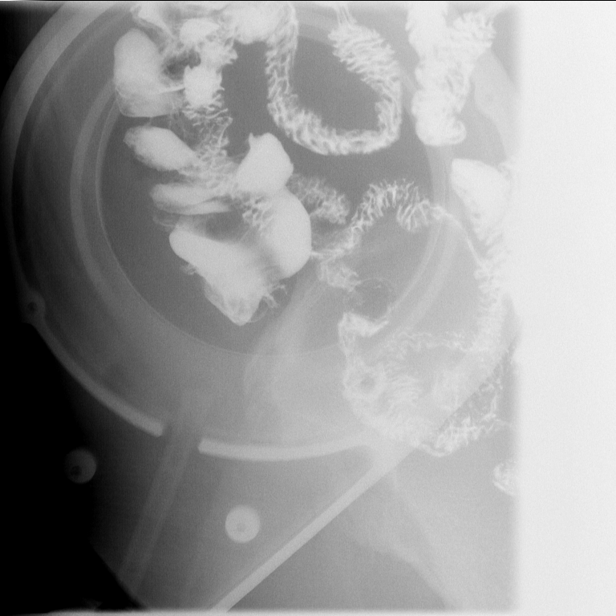

[Series 18: fluoro_barium 2fps_bw · 0.18mm/px · 1 of 1 slices shown (3 of 3)]
[im 1/1]
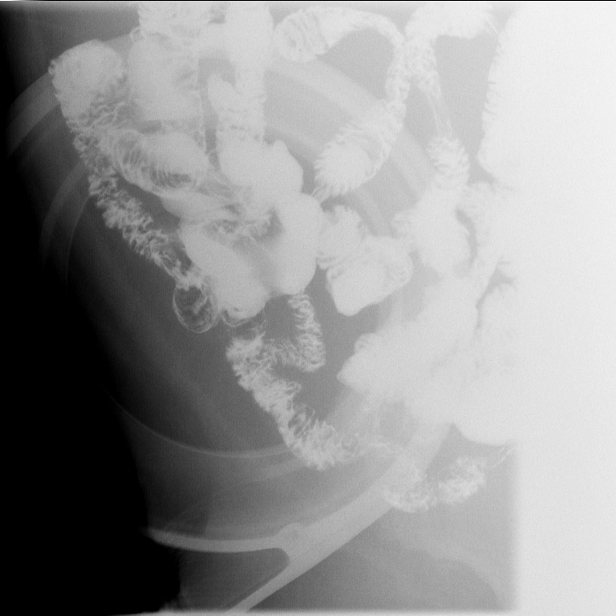

[15 of 17 positions shown; findings below may reference images not displayed]

FINDINGS: The anticipated procedure was discussed with the patient. He voiced
his willingness to proceed. The scout radiograph revealed a normal
bowel gas pattern.

The patient ingested thick and thin barium and the gas-forming
crystals without difficulty. The esophagus was normal in a survey
fashion. A small reducible hiatal hernia was observed in the was a
small amount of gastroesophageal reflux. No mucosal changes of
esophagitis were observed.

The stomach distended well. The mucosal fold pattern was normal. No
ulcer niche was observed. Gastric emptying was prompt. The duodenal
bulb and C-sweep were normal in appearance and position. The 12 mm
barium tablet passed without difficulty. Over the course of
approximately 25 minutes the barium passed through the small bowel
and reached the terminal ileum. These jejunum and ileum demonstrated
normal mucosal pattern. The terminal ileum distended well and
exhibited normal peristalsis. The patient voice no focal tenderness
to palpation over the abdomen.
IMPRESSION: Small reducible hiatal hernia with small amount of gastroesophageal
reflux. No evidence of an esophageal stricture.

Normal appearance of the stomach and duodenum.

Normal appearance of the jejunum and ileum.

## 2019-07-28 ENCOUNTER — Ambulatory Visit: Payer: PRIVATE HEALTH INSURANCE | Admitting: Dermatology

## 2019-08-21 ENCOUNTER — Ambulatory Visit: Payer: Self-pay | Admitting: Dermatology

## 2019-09-24 ENCOUNTER — Ambulatory Visit: Payer: Self-pay | Admitting: Dermatology

## 2019-11-03 ENCOUNTER — Other Ambulatory Visit: Payer: Self-pay

## 2019-11-03 MED ORDER — HUMIRA (2 PEN) 40 MG/0.4ML ~~LOC~~ AJKT: 40.0000 mg | AUTO-INJECTOR | SUBCUTANEOUS | 2 refills | Status: DC

## 2019-11-03 NOTE — Telephone Encounter (Signed)
Fax from [facility] requesting Humira RFs.

## 2019-12-31 ENCOUNTER — Ambulatory Visit: Payer: Self-pay | Admitting: Dermatology

## 2020-02-04 ENCOUNTER — Ambulatory Visit: Payer: BC Managed Care – PPO | Admitting: Dermatology

## 2020-02-04 ENCOUNTER — Other Ambulatory Visit: Payer: Self-pay

## 2020-02-04 DIAGNOSIS — L578 Other skin changes due to chronic exposure to nonionizing radiation: Secondary | ICD-10-CM | POA: Diagnosis not present

## 2020-02-04 DIAGNOSIS — L4050 Arthropathic psoriasis, unspecified: Secondary | ICD-10-CM | POA: Diagnosis not present

## 2020-02-04 DIAGNOSIS — L821 Other seborrheic keratosis: Secondary | ICD-10-CM | POA: Diagnosis not present

## 2020-02-04 DIAGNOSIS — L409 Psoriasis, unspecified: Secondary | ICD-10-CM | POA: Diagnosis not present

## 2020-02-04 MED ORDER — CALCIPOTRIENE 0.005 % EX FOAM: 1.0000 "application " | CUTANEOUS | 2 refills | Status: DC

## 2020-02-04 MED ORDER — HUMIRA (2 PEN) 40 MG/0.4ML ~~LOC~~ AJKT: 40.0000 mg | AUTO-INJECTOR | SUBCUTANEOUS | 3 refills | Status: DC

## 2020-02-04 NOTE — Progress Notes (Signed)
   Follow-Up Visit   Subjective  Peter Mccann is a 64 y.o. male who presents for the following: Psoriasis (with PA, scalp, ears, joints, Humira 40mg  sq injections qowk, start up labs 06/17/19, pt doesnt feel since restarting Humira 06/2019 that joints or skin have improved as much as they did when previously on humira several years ago).  The following portions of the chart were reviewed this encounter and updated as appropriate:  Tobacco  Allergies  Meds  Problems  Med Hx  Surg Hx  Fam Hx     Review of Systems:  No other skin or systemic complaints except as noted in HPI or Assessment and Plan.  Objective  Well appearing patient in no apparent distress; mood and affect are within normal limits.  A focused examination was performed including scalp, ears, face. Relevant physical exam findings are noted in the Assessment and Plan.  Objective  Scalp, ears, joints: Peeling on eyelids, post auriculars, few guttate areas legs, trunk clear   Assessment & Plan    Seborrheic Keratoses - Stuck-on, waxy, tan-brown papules and plaques  - Discussed benign etiology and prognosis. - Observe - Call for any changes  Actinic Damage - diffuse scaly erythematous macules with underlying dyspigmentation - Recommend daily broad spectrum sunscreen SPF 30+ to sun-exposed areas, reapply every 2 hours as needed.  - Call for new or changing lesions.  Psoriasis, psoriatic arthritis-severe-currently on Humira systemic biologic injections.  Patient still has joint aches and some skin lesions.  His scalp has flared with psoriasis recently. Scalp, ears, joints With Psoriatic Arthritis  Recommend washing hair qd with tar shampoos or salicylic acid shampoo.  Start Sorilux foam 3d/wk scalp, ears, eyelids at bedtime  Cont Humira 40mg  sq injections qowk  Discussed referring pt to rheumatologist and pt declines  Calcipotriene (SORILUX) 0.005 % FOAM - Scalp, ears, joints  Adalimumab (HUMIRA PEN)  40 MG/0.4ML PNKT - Scalp, ears, joints  Return in about 3 months (around 05/05/2020) for Psoriasis.   I, , RMA, am acting as scribe for 05/07/2020, MD .  Documentation: I have reviewed the above documentation for accuracy and completeness, and I agree with the above.  Ardis Rowan, MD

## 2020-02-05 ENCOUNTER — Encounter: Payer: Self-pay | Admitting: Dermatology

## 2020-02-11 ENCOUNTER — Telehealth: Payer: Self-pay

## 2020-02-11 NOTE — Telephone Encounter (Signed)
Calcipotriene Foam denied by [employer]. He must try and fail the cream/solution or ointment first. Solution ok as a replacement for scalp, ears and eyelids?

## 2020-02-11 NOTE — Telephone Encounter (Signed)
May use calcipotriene cream for non-hairy areas And Solution for hairy areas Send both in

## 2020-02-12 MED ORDER — CALCIPOTRIENE 0.005 % EX SOLN: 1.0000 "application " | Freq: Two times a day (BID) | CUTANEOUS | 0 refills | Status: DC

## 2020-02-12 MED ORDER — CALCIPOTRIENE 0.005 % EX CREA: TOPICAL_CREAM | Freq: Two times a day (BID) | CUTANEOUS | 1 refills | Status: DC

## 2020-02-12 NOTE — Telephone Encounter (Signed)
Spoke with pt and informed him of new prescriptions.

## 2020-02-12 NOTE — Telephone Encounter (Signed)
RXs sent in and left msg with male to return my call.

## 2020-05-19 ENCOUNTER — Ambulatory Visit: Payer: BC Managed Care – PPO | Admitting: Dermatology

## 2020-06-02 ENCOUNTER — Other Ambulatory Visit: Payer: Self-pay

## 2020-06-02 DIAGNOSIS — L409 Psoriasis, unspecified: Secondary | ICD-10-CM

## 2020-06-02 MED ORDER — HUMIRA (2 PEN) 40 MG/0.4ML ~~LOC~~ AJKT: 40.0000 mg | AUTO-INJECTOR | SUBCUTANEOUS | 2 refills | Status: DC

## 2021-07-27 ENCOUNTER — Ambulatory Visit
Payer: BC Managed Care – PPO | Attending: Student in an Organized Health Care Education/Training Program | Admitting: Student in an Organized Health Care Education/Training Program

## 2021-07-27 ENCOUNTER — Other Ambulatory Visit: Payer: Self-pay

## 2021-07-27 ENCOUNTER — Encounter: Payer: Self-pay | Admitting: Student in an Organized Health Care Education/Training Program

## 2021-07-27 VITALS — BP 128/72 | HR 62 | Temp 97.7°F | Resp 16 | Ht 70.0 in | Wt 260.0 lb

## 2021-07-27 DIAGNOSIS — G5681 Other specified mononeuropathies of right upper limb: Secondary | ICD-10-CM | POA: Diagnosis present

## 2021-07-27 DIAGNOSIS — G5682 Other specified mononeuropathies of left upper limb: Secondary | ICD-10-CM | POA: Diagnosis present

## 2021-07-27 DIAGNOSIS — G894 Chronic pain syndrome: Secondary | ICD-10-CM | POA: Diagnosis present

## 2021-07-27 DIAGNOSIS — M19011 Primary osteoarthritis, right shoulder: Secondary | ICD-10-CM | POA: Insufficient documentation

## 2021-07-27 DIAGNOSIS — M25512 Pain in left shoulder: Secondary | ICD-10-CM | POA: Diagnosis present

## 2021-07-27 DIAGNOSIS — M25511 Pain in right shoulder: Secondary | ICD-10-CM | POA: Insufficient documentation

## 2021-07-27 DIAGNOSIS — M19012 Primary osteoarthritis, left shoulder: Secondary | ICD-10-CM

## 2021-07-27 MED ORDER — TIZANIDINE HCL 4 MG PO TABS: 4.0000 mg | ORAL_TABLET | Freq: Every evening | ORAL | 2 refills | Status: AC | PRN

## 2021-07-27 MED ORDER — PREGABALIN 100 MG PO CAPS: 100.0000 mg | ORAL_CAPSULE | Freq: Two times a day (BID) | ORAL | 2 refills | Status: DC

## 2021-07-27 NOTE — Patient Instructions (Signed)

## 2021-07-27 NOTE — Progress Notes (Signed)
Patient: Peter Mccann  Service Category: E/M  Provider: Edward Jolly, MD  ?DOB: [birth year]  DOS: 07/27/2021  Referring Provider: Lynnea Ferrier, MD  ?MRN: [id number]  Setting: Ambulatory outpatient  PCP: Lynnea Ferrier, MD  ?Type: New Patient  Specialty: Interventional Pain Management    ?Location: Office  Delivery: Face-to-face    ? ?Primary Reason(s) for Visit: Encounter for initial evaluation of one or more chronic problems (new to examiner) potentially causing chronic pain, and posing a threat to normal musculoskeletal function. (Level of risk: High) ?CC: Shoulder Pain (bilateral) ? ?HPI  ?Peter Mccann is a 66 y.o. year old, male patient, who comes for the first time to our practice referred by Lynnea Ferrier, MD for our initial evaluation of his chronic pain. He has Alcoholic ketosis (HCC); CAD (coronary artery disease); Localized osteoarthritis of shoulder regions, bilateral; Pain of both shoulder joints; Suprascapular entrapment neuropathy of right side; and Chronic pain syndrome on their problem list. Today he comes in for evaluation of his Shoulder Pain (bilateral) ? ?Pain Assessment: ?Location: Right, Left Shoulder ?Radiating: both arms ?Onset: More than a month ago ?Duration: Chronic pain ?Quality: Constant ?Severity: 10-Worst pain ever/10 (subjective, self-reported pain score)  ?Effect on ADL: difficulty perfoming daily activities ?Timing: Constant ?Modifying factors: Aleve ?BP: 128/72  HR: 62 ? ?Onset and Duration: Gradual ?Cause of pain: Unknown ?Severity: Getting worse, NAS-11 at its worse: 8/10, NAS-11 at its best: 8/10, NAS-11 now: 8/10, and NAS-11 on the average: 8/10 ?Timing: Not influenced by the time of the day, During activity or exercise, and After activity or exercise ?Aggravating Factors: Lifiting and Motion ?Alleviating Factors: Medications and Warm showers or baths ?Associated Problems: Personality changes, Pain that wakes patient up, and Pain that does not allow patient to  sleep ?Quality of Pain: Constant, Throbbing, and Tingling ?Previous Examinations or Tests: MRI scan and X-rays ?Previous Treatments: Steroid treatments by mouth, Strengthening exercises, Stretching exercises, TENS, and Trigger point injections ? ?Peter Mccann is a pleasant 66 year old male who presents with a chief complaint of bilateral shoulder pain related to shoulder osteoarthritis.  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  He started to experience shoulder pain in 2010 which is progressively gotten worse over the last 13 years.  He has been offered shoulder surgery in 2014 which he declined.  He states that he has had family members that did shoulder surgery which was not helpful.  He has had ultrasound-guided shoulder steroid injection with Dr. Landry Mellow without any benefit.  He is currently on Lyrica 100 mg daily.  He has also tried ibuprofen, naproxen with limited response.  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 ? ?Of note, patient does have a history of substance abuse primarily alcohol abuse, he has been sober since 2020.  Also very remote history of cocaine abuse.  ? ?He was accompanied today by his wife. ? ?Today I took the time to provide the patient with information regarding my pain practice. The patient was informed that my practice is divided into two sections: an interventional pain management section, as well as a completely separate and distinct medication management section. I explained that I have procedure days for my interventional therapies, and evaluation days for follow-ups and medication management. Because of the amount of documentation required during both, they are kept separated. This means that there is the possibility that he may be scheduled for  a procedure on one day, and medication management the next. I have also informed him that because of staffing and facility limitations, I no  longer take patients for medication management only. To illustrate the reasons for this, I gave the patient the example of surgeons, and how inappropriate it would be to refer a patient to his/her care, just to write for the post-surgical antibiotics on a surgery done by a different surgeon.  ? ?Because interventional pain management is my board-certified specialty, the patient was informed that joining my practice means that they are open to any and all interventional therapies. I made it clear that this does not mean that they will be forced to have any procedures done. What this means is that I believe interventional therapies to be essential part of the diagnosis and proper management of chronic pain conditions. Therefore, patients not interested in these interventional alternatives will be better served under the care of a different practitioner. ? ?The patient was also made aware of my Comprehensive Pain Management Safety Guidelines where by joining my practice, they limit all of their nerve blocks and joint injections to those done by our practice, for as long as we are retained to manage their care.  ? ?Historic Controlled Substance Pharmacotherapy Review  ? ?Historical Monitoring: ?The patient  reports no history of drug use. ?List of all UDS Test(s): ?Lab Results  ?Component Value Date  ? MDMA NONE DETECTED 07/23/2017  ? MDMA NONE DETECTED 08/07/2016  ? MDMA NEGATIVE 12/30/2012  ? COCAINSCRNUR NONE DETECTED 07/23/2017  ? COCAINSCRNUR NONE DETECTED 08/07/2016  ? COCAINSCRNUR NONE DETECTED 02/22/2013  ? COCAINSCRNUR NEGATIVE 12/30/2012  ? PCPSCRNUR NONE DETECTED 07/23/2017  ? PCPSCRNUR NONE DETECTED 08/07/2016  ? PCPSCRNUR NEGATIVE 12/30/2012  ? THCU NONE DETECTED 07/23/2017  ? THCU NONE DETECTED 08/07/2016  ? THCU NONE DETECTED 02/22/2013  ? THCU NEGATIVE 12/30/2012  ? ETH 216 (H) 05/20/2018  ? ETH 415 (HH) 08/06/2016  ? ETH 267 (H) 02/22/2013  ? ETH 403 (HH) 02/22/2013  ? ?List of other Serum/Urine Drug  Screening Test(s):  ?Lab Results  ?Component Value Date  ? COCAINSCRNUR NONE DETECTED 07/23/2017  ? COCAINSCRNUR NONE DETECTED 08/07/2016  ? COCAINSCRNUR NONE DETECTED 02/22/2013  ? COCAINSCRNUR NEGATIVE 12/30/2012  ? THCU NONE DETECTED 07/23/2017  ? THCU NONE DETECTED 08/07/2016  ? THCU NONE DETECTED 02/22/2013  ? THCU NEGATIVE 12/30/2012  ? ETH 216 (H) 05/20/2018  ? ETH 415 (HH) 08/06/2016  ? ETH 267 (H) 02/22/2013  ? ETH 403 (HH) 02/22/2013  ? ?Historical Background Evaluation: ?[place] not reviewed this encounter. Online review of the past 7352-month period conducted.             ? ?[place] Department of public safety, offender search: Engineer, mining(Public Information) Non-contributory ?Risk Assessment Profile: ?Aberrant behavior: None observed or detected today ?Risk factors for fatal opioid overdose: history of alcoholism, history of attempted suicide , history of substance abuse, and history of substance use disorder ?Fatal overdose hazard ratio (HR): Calculation deferred ?Non-fatal overdose hazard ratio (HR): Calculation deferred ?Risk of opioid abuse or dependence: 0.7-3.0% with doses ? 36 MME/day and 6.1-26% with doses ? 120 MME/day. ?Substance use disorder (SUD) risk level: See below ?Personal History of Substance Abuse (SUD-Substance use disorder):  ?Alcohol: Positive Male or Male  ?Illegal Drugs: Negative  ?Rx Drugs: Negative  ?ORT Risk Level calculation: Low Risk ? Opioid Risk Tool - 07/27/21 1327   ? ?  ? Family History of Substance Abuse  ? Alcohol  Negative   ? Illegal Drugs Negative   ? Rx Drugs Negative   ?  ? Personal History of Substance Abuse  ? Alcohol Positive Male or Male   ? Illegal Drugs Negative   ? Rx Drugs Negative   ?  ? Age  ? Age between 16-45 years  No   ?  ? History of Preadolescent Sexual Abuse  ? History of Preadolescent Sexual Abuse Negative or Male   ?  ? Psychological Disease  ? Psychological Disease Negative   ? Depression Negative   ?  ? Total Score  ? Opioid Risk Tool Scoring 3   ?  Opioid Risk Interpretation Low Risk   ? ?  ?  ? ?  ? ?ORT Scoring interpretation table:  ?Score <3 = Low Risk for SUD  ?Score between 4-7 = Moderate Risk for SUD  ?Score >8 = High Risk for Opioid Abuse  ? ?PHQ-2 Depr

## 2021-08-01 ENCOUNTER — Ambulatory Visit: Payer: BC Managed Care – PPO | Admitting: Student in an Organized Health Care Education/Training Program

## 2021-10-23 ENCOUNTER — Other Ambulatory Visit: Payer: Self-pay | Admitting: Student in an Organized Health Care Education/Training Program

## 2022-02-04 ENCOUNTER — Other Ambulatory Visit: Payer: Self-pay | Admitting: Student in an Organized Health Care Education/Training Program

## 2023-03-30 ENCOUNTER — Ambulatory Visit: Payer: Medicare HMO | Admitting: General Practice

## 2023-03-30 ENCOUNTER — Ambulatory Visit
Admission: RE | Admit: 2023-03-30 | Discharge: 2023-03-30 | Disposition: A | Discharge status: Home / Self Care | Payer: Medicare HMO | Attending: Internal Medicine | Admitting: Internal Medicine

## 2023-03-30 ENCOUNTER — Encounter: Payer: Self-pay | Admitting: Internal Medicine

## 2023-03-30 ENCOUNTER — Other Ambulatory Visit: Payer: Self-pay

## 2023-03-30 ENCOUNTER — Encounter
Admission: RE | Disposition: A | Discharge status: Home / Self Care | Payer: Self-pay | Source: Home / Self Care | Attending: Internal Medicine

## 2023-03-30 DIAGNOSIS — Z955 Presence of coronary angioplasty implant and graft: Secondary | ICD-10-CM | POA: Insufficient documentation

## 2023-03-30 DIAGNOSIS — E1151 Type 2 diabetes mellitus with diabetic peripheral angiopathy without gangrene: Secondary | ICD-10-CM | POA: Insufficient documentation

## 2023-03-30 DIAGNOSIS — Z79899 Other long term (current) drug therapy: Secondary | ICD-10-CM | POA: Insufficient documentation

## 2023-03-30 DIAGNOSIS — Z7985 Long-term (current) use of injectable non-insulin antidiabetic drugs: Secondary | ICD-10-CM | POA: Diagnosis not present

## 2023-03-30 DIAGNOSIS — Z7901 Long term (current) use of anticoagulants: Secondary | ICD-10-CM | POA: Insufficient documentation

## 2023-03-30 DIAGNOSIS — I2510 Atherosclerotic heart disease of native coronary artery without angina pectoris: Secondary | ICD-10-CM | POA: Insufficient documentation

## 2023-03-30 DIAGNOSIS — I10 Essential (primary) hypertension: Secondary | ICD-10-CM | POA: Diagnosis not present

## 2023-03-30 DIAGNOSIS — Z7982 Long term (current) use of aspirin: Secondary | ICD-10-CM | POA: Diagnosis not present

## 2023-03-30 DIAGNOSIS — I4819 Other persistent atrial fibrillation: Secondary | ICD-10-CM

## 2023-03-30 DIAGNOSIS — Z6831 Body mass index (BMI) 31.0-31.9, adult: Secondary | ICD-10-CM | POA: Diagnosis not present

## 2023-03-30 DIAGNOSIS — I4891 Unspecified atrial fibrillation: Secondary | ICD-10-CM | POA: Diagnosis present

## 2023-03-30 DIAGNOSIS — E669 Obesity, unspecified: Secondary | ICD-10-CM | POA: Diagnosis not present

## 2023-03-30 DIAGNOSIS — E785 Hyperlipidemia, unspecified: Secondary | ICD-10-CM | POA: Diagnosis not present

## 2023-03-30 HISTORY — PX: CARDIOVERSION: SHX1299

## 2023-03-30 SURGERY — CARDIOVERSION
Anesthesia: General

## 2023-03-30 MED ORDER — SODIUM CHLORIDE 0.9 % IV SOLN: INTRAVENOUS | Status: DC

## 2023-03-30 MED ORDER — PROPOFOL 10 MG/ML IV BOLUS
INTRAVENOUS | Status: DC | PRN
  Administered 2023-03-30: 30 mg via INTRAVENOUS
  Administered 2023-03-30: 70 mg via INTRAVENOUS

## 2023-03-30 NOTE — Anesthesia Preprocedure Evaluation (Signed)
Anesthesia Evaluation  Patient identified by MRN, date of birth, ID band Patient awake    Reviewed: Allergy & Precautions, NPO status , Patient's Chart, lab work & pertinent test results, reviewed documented beta blocker date and time   Airway Mallampati: III  TM Distance: >3 FB     Dental  (+) Poor Dentition   Pulmonary neg pulmonary ROS   Pulmonary exam normal        Cardiovascular hypertension, Pt. on medications and Pt. on home beta blockers + dysrhythmias Atrial Fibrillation      Neuro/Psych  PSYCHIATRIC DISORDERS  Depression    negative neurological ROS     GI/Hepatic Neg liver ROS,GERD  ,,  Endo/Other  diabetes, Well Controlled, Type 2, Oral Hypoglycemic Agents    Renal/GU      Musculoskeletal   Abdominal   Peds  Hematology  (+) Blood dyscrasia, anemia   Anesthesia Other Findings   Reproductive/Obstetrics                             Anesthesia Physical Anesthesia Plan  ASA: 3  Anesthesia Plan: General   Post-op Pain Management: Minimal or no pain anticipated   Induction: Intravenous  PONV Risk Score and Plan: 3 and Propofol infusion, TIVA and Ondansetron  Airway Management Planned: Nasal Cannula  Additional Equipment: None  Intra-op Plan:   Post-operative Plan:   Informed Consent: I have reviewed the patients History and Physical, chart, labs and discussed the procedure including the risks, benefits and alternatives for the proposed anesthesia with the patient or authorized representative who has indicated his/her understanding and acceptance.     Dental advisory given  Plan Discussed with: CRNA and Surgeon  Anesthesia Plan Comments: (Discussed risks of anesthesia with patient, including possibility of difficulty with spontaneous ventilation under anesthesia necessitating airway intervention, PONV, and rare risks such as cardiac or respiratory or neurological  events, and allergic reactions. Discussed the role of CRNA in patient's perioperative care. Patient understands.)       Anesthesia Quick Evaluation

## 2023-03-30 NOTE — Anesthesia Postprocedure Evaluation (Signed)
Anesthesia Post Note  Patient: Peter Mccann  Procedure(s) Performed: CARDIOVERSION  Patient location during evaluation: PACU Anesthesia Type: General Level of consciousness: awake and alert Pain management: pain level controlled Vital Signs Assessment: post-procedure vital signs reviewed and stable Respiratory status: spontaneous breathing, nonlabored ventilation, respiratory function stable and patient connected to nasal cannula oxygen Cardiovascular status: blood pressure returned to baseline and stable Postop Assessment: no apparent nausea or vomiting Anesthetic complications: no  No notable events documented.   Last Vitals:  Vitals:   03/30/23 1239 03/30/23 1245  BP:  102/67  Pulse: 60 64  Resp: 15 14  Temp:    SpO2: 93% 97%    Last Pain:  Vitals:   03/30/23 1245  TempSrc:   PainSc: 0-No pain                 Stephanie Coup

## 2023-03-30 NOTE — Transfer of Care (Signed)
Immediate Anesthesia Transfer of Care Note  Patient: Lathen Cucco  Procedure(s) Performed: CARDIOVERSION  Patient Location: Short Stay  Anesthesia Type:General  Level of Consciousness: drowsy  Airway & Oxygen Therapy: Patient Spontanous Breathing and Patient connected to nasal cannula oxygen  Post-op Assessment: Report given to RN, Post -op Vital signs reviewed and stable, and Patient moving all extremities  Post vital signs: Reviewed and stable  Last Vitals:  Vitals Value Taken Time  BP 136/83 03/30/23 1236  Temp    Pulse 60 03/30/23 1239  Resp 15 03/30/23 1239  SpO2 93 % 03/30/23 1239  Vitals shown include unfiled device data.  Last Pain:  Vitals:   03/30/23 1142  TempSrc: Oral  PainSc: 0-No pain         Complications: No notable events documented.

## 2023-03-30 NOTE — H&P (Signed)
Established Patient Visit   Chief Complaint: Chief Complaint  Patient presents with  Follow-up  Testing  Date of Service: 03/26/2023 Date of Birth: August 27, 1955 PCP: Sallee Provencal, MD [address] [facility] [place] [address]  History of Present Illness:   Peter Mccann is a 67 y.o.male patient that presents for 1 year f/u. Last seen by Leanora Ivanoff, PA 01/26/22.   1. 2.75 x 12 mm Resolute Onyx mid LAD 11/06/2018 - cardiac catheterization on 11/06/2018 revealed 90% stenosis mid LAD with normal left ventricular function, with LVEF 55%. Resolute Onyx stent to LAD. -aspirin, atorvastatin, and carvedilol for secondary ASCVD prevention  2. Essential hypertension -Management: carvedilol 12.5 mg BID 3. Hyperlipidemia -atorvastatin 80 mg with LDL cholesterol was 56 on 09/19/82 labs 4. Type 2 diabetes -A1c was 6.0% on 09/13/22 labs - on Mounjaro 5. Obesity  -with 50 lb intentional wt loss with GLP-1 6. PAF -new diagnosis 02/21/2023 by EKG, pt asymptomatic -72 hr Holter 02/21/23 showed predominant atrial fibrillation with 34% in RVR -CHADSVASc score 4 - on Eliquis 5 mg BID for stroke prevention -Rate control: carvedilol 12.5 mg BID  Pt seen 02/21/23. 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 Asymptomatic. EKG done for annual evaluation due to CAD. Pt asymptomatic. EKG showed new onset atrial fibrillation with HR 101. He reports starting topical testosterone therapy within last 2-3 weeks. Last testosterone serum level 01/24/23 190 ng/dL. 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 Recently lost 50 lbs and all diabetes medications were stopped except for Mounjaro. 72-hr Holter, ECHO, and Myoview ordered to evaluate new PAF diagnosis and etiology. Lab w/u showed normal TSH, BMP (except for mildly elevated glucose), Mg, and CBC.  Patient presents today to review diagnostic  results. He continues to be asymptomatic. 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   72-hour Holter monitor 02/21/2023 - 02/24/2023 revealed predominant atrial fibrillation/atrial flutter with mean heart rate 99 bpm, heart rate range 57 to 180 bpm. Infrequent premature ventricular contractions were present. There were no diary entries.   ECHO 03/07/23 showed mild LVH with mild systolic dysfunction, EF 50%. Normal RV systolic function. Mild MR and TR. No valvular stenosis.  ETT Myoview done 03/07/23. ETT per Bruce protocol. Pt exercised 4:00 minutes, stopped due to leg claudication. METS 7.00 and 97% of max HR achieved. No EKG changes noted. Normal perfusion analysis without evidence for scar or ischemia. Normal wall motion. Normal LVEF 52%.  Past Medical and Surgical History  Past Medical History Past Medical History:  Diagnosis Date  Alcoholism /alcohol abuse  B12 deficiency 06/18/2016  Coronary artery disease involving native coronary artery of native heart without angina pectoris 12/30/2018  90% mid LAD lesion by cardiac catheterization 11/06/2018. Status post DES. Followed by Dr. Darrold Junker  Depression  Diabetes mellitus type 2, uncomplicated (CMS/HHS-HCC)  Essential hypertension, benign  History of suicide attempt 1997  When using drugs  Other and unspecified hyperlipidemia  Other testicular hypofunction  Plaque psoriasis 06/15/2016  Psoriasis  Substance abuse (CMS/HHS-HCC)   Past Surgical History He has a past surgical history that includes Fracture surgery (Right, 1972); Extracranial Reconstruction Facial Bones (1997); Colonoscopy (02/28/2010); egd (07/23/2017); and Colonoscopy (07/23/2017).   Medications and Allergies  Current Medications  Current Outpatient Medications on File Prior to Visit  Medication Sig Dispense Refill  apixaban (ELIQUIS) 5 mg tablet Take  1 tablet (5 mg total) by mouth every 12 (twelve) hours 60 tablet 11   aspirin 81 MG EC tablet Take 81 mg by mouth once daily  atorvastatin (LIPITOR) 80 MG tablet TAKE 1 TABLET BY MOUTH DAILY 90 tablet 3  blood glucose diagnostic (ACCU-CHEK GUIDE TEST STRIPS) test strip by XX route 3 (three) times daily Use as instructed. Checks twice a day 100 each 11  carvediloL (COREG) 25 MG tablet Take 0.5 tablets (12.5 mg total) by mouth 2 (two) times daily 90 tablet 3  clobetasoL (CLOBEX) 0.05 % shampoo  clobetasoL (CORMAX) 0.05 % external solution  clobetasoL (TEMOVATE) 0.05 % ointment  DULoxetine (CYMBALTA) 60 MG DR capsule  magnesium oxide (MAG-OX) 400 mg (241.3 mg magnesium) tablet Take 400 mg by mouth once daily  pantoprazole (PROTONIX) 40 MG DR tablet TAKE ONE TABLET BY MOUTH TWICE A DAY 181 tablet 3  potassium chloride (KLOR-CON) 10 MEQ ER tablet TAKE 1 TABLET BY MOUTH DAILY 90 tablet 3  pregabalin (LYRICA) 100 MG capsule Take 1 capsule (100 mg total) by mouth 2 (two) times daily 60 capsule 5  testosterone (ANDROGEL) 50 mg/5 gram (1 %) gel packet Apply 1 packet (50 mg of testosterone total) topically once daily Apply to the upper arms and/or abdomen. 30 packet 5  tirzepatide (MOUNJARO) 7.5 mg/0.5 mL pen injector Inject 0.5 mLs (7.5 mg total) subcutaneously once a week 2 mL 5  traZODone (DESYREL) 50 MG tablet 50 to 100 mg PO at bedtime 180 tablet 3   No current facility-administered medications on file prior to visit.   Allergies: Metformin and Metformin (bulk)  Social and Family History  Social History reports that he has never smoked. He uses smokeless tobacco. He reports that he does not currently use alcohol. He reports that he does not use drugs.  Family History Family History  Problem Relation Name Age of Onset  High blood pressure (Hypertension) Father  Stroke Father  Coronary Artery Disease (Blocked arteries around heart) Father  Lung cancer Father  Colon cancer Father  Kidney failure Brother  Diabetes Mother  Arthritis Mother  rheumatoid    Review of Systems   Review of Systems  Respiratory: Negative for shortness of breath.  Cardiovascular: Negative for chest pain, palpitations and leg swelling.  Neurological: Negative for dizziness.  Endo/Heme/Allergies: Does not bruise/bleed easily.   Physical Examination   Vitals:BP 128/76  Pulse 93  Ht 177.8 cm (5\' 10" )  Wt 99.8 kg (220 lb)  SpO2 97%  BMI 31.57 kg/m  Ht:177.8 cm (5\' 10" ) Wt:99.8 kg (220 lb) ZOX:WRUE surface area is 2.22 meters squared. Body mass index is 31.57 kg/m.  Physical Exam Vitals reviewed.  Constitutional:  General: He is not in acute distress. Appearance: Normal appearance.  Neck:  Vascular: No carotid bruit.  Cardiovascular:  Rate and Rhythm: Normal rate. Rhythm irregularly irregular.  Pulses: Normal pulses.  Radial pulses are 2+ on the right side and 2+ on the left side.  Heart sounds: Normal heart sounds. No murmur heard. Pulmonary:  Effort: Pulmonary effort is normal. No respiratory distress.  Breath sounds: Normal breath sounds.  Musculoskeletal:  Right lower leg: No edema.  Left lower leg: No edema.  Neurological:  Mental Status: He is alert.   EKG today - atrial fibrillation, HR 88 bpm  Data & Results   Recent Labs  03/14/22 0708 09/13/22 0709 03/12/23 0707  CHOLTOTAL 145 124 130  HDL 53.2 44.2 53.0  LDLCALC 76 56 46  VLDL 16 24 31  TRIG 81 120 155   Recent Labs  03/14/22 0708 09/13/22 0709 02/21/23 1025 03/12/23 0707  NA 141 140 142 141  K 4.2 4.1 4.5 4.2  BUN 16 24 16 17   CREATININE 0.9 0.9 0.8 0.9  CO2 32.0 28.7 29.1 31.5  GLUCOSE 75 117* 121* 140*  ALT 25 17 -- 18  AST 20 18 -- 19  TBILI 1.0 1.3* -- 1.0  ALB 4.1 4.2 -- 4.0   Recent Labs  09/13/22 0709 02/21/23 1025 03/12/23 0707  WBC 8.1 6.5 7.3  HGB 15.5 14.2 14.8  HCT 45.6 41.6 44.2  MCV 88.9 92.0 91.3  PLT 165 154 177   Recent Labs  03/14/22 0708 09/13/22 0709 02/21/23 1025 03/12/23 0707  TSH -- 1.897 1.449 2.565  HGBA1C 6.6* 6.0*  -- 6.0*    Assessment   67 y.o. male with  Encounter Diagnoses  Name Primary?  Claudication of lower extremity (CMS-HCC)  Type 2 diabetes mellitus with other circulatory complication, without long-term current use of insulin (CMS/HHS-HCC)  Coronary artery disease involving native coronary artery of native heart without angina pectoris  New onset atrial fibrillation (CMS/HHS-HCC) Yes  Hyperlipidemia, unspecified hyperlipidemia type  Hypertension, essential   Plan   Orders Placed This Encounter  Procedures  Ambulatory Referral to Vascular Surgery  ECG 12-lead   1. Coronary artery disease involving native coronary artery of native heart without angina pectoris ETT Myoview without evidence of ischemia. Pt without functional decline or chest pain. Continue aspirin, atorvastatin, and carvedilol for secondary ASCVD prevention. Continue Eliquis for stroke prevention with atrial fibrillation.  2. Hyperlipidemia, unspecified hyperlipidemia type LDL cholesterol was 56 on 05/17/06 labs, at goal. Continue atorvastatin 80 mg qd.  3. New onset atrial fibrillation (CMS/HHS-HCC) New diagnosis on EKG 02/21/23. Predominant atrial fibrillation on 72-hr Holter. Pt in AF on EKG today. Continues to be asymptomatic. Low normal function on ECHO with no significant valvular disease. No evidence of ischemia on Myoview. Discussed importance of early conversion to NSR with new diagnosis of atrial fibrillation. Discussed pharmacological rhythm control with amiodarone vs DCCV. Flecainide contraindicated with patient's CAD. Pt prefers DCCV. Consent obtained - scheduled for Friday 03/30/23. Continue Eliquis 5 mg BID for stroke prevention - pt has been on DOAC for 1 month. Continue carvedilol 12.5 mg BID. Holter with 34% RVR with rate 88 bpm on EKG today. With DCCV scheduled for this week, will hold on increasing rate control with pt being asymptomatic.  4. HTN BP at goal - continue current regimen. No changes.  5.  Claudication of lower extremity Claudication and weakness of BLE during ETT and at work. Pt with CAD and T2DM. Refer Vascular Surgery for evaluation and management. Pt denies BLE skin changes.   Follow up after DCCV.

## 2023-06-06 ENCOUNTER — Other Ambulatory Visit: Payer: Self-pay | Admitting: Internal Medicine

## 2023-06-06 DIAGNOSIS — R519 Headache, unspecified: Secondary | ICD-10-CM

## 2023-06-06 DIAGNOSIS — E1159 Type 2 diabetes mellitus with other circulatory complications: Secondary | ICD-10-CM

## 2023-06-20 ENCOUNTER — Ambulatory Visit
Admission: RE | Admit: 2023-06-20 | Discharge: 2023-06-20 | Disposition: A | Discharge status: Home / Self Care | Payer: Medicare Other | Source: Ambulatory Visit | Attending: Internal Medicine | Admitting: Internal Medicine

## 2023-06-20 DIAGNOSIS — R519 Headache, unspecified: Secondary | ICD-10-CM | POA: Diagnosis present

## 2023-06-20 DIAGNOSIS — E1159 Type 2 diabetes mellitus with other circulatory complications: Secondary | ICD-10-CM | POA: Insufficient documentation
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
## 2024-01-31 ENCOUNTER — Other Ambulatory Visit: Payer: Self-pay | Admitting: Internal Medicine

## 2024-01-31 ENCOUNTER — Ambulatory Visit
Admission: RE | Admit: 2024-01-31 | Discharge: 2024-01-31 | Disposition: A | Discharge status: Home / Self Care | Source: Ambulatory Visit | Attending: Internal Medicine | Admitting: Internal Medicine

## 2024-01-31 DIAGNOSIS — S01112A Laceration without foreign body of left eyelid and periocular area, initial encounter: Secondary | ICD-10-CM | POA: Insufficient documentation

## 2024-01-31 DIAGNOSIS — W19XXXA Unspecified fall, initial encounter: Secondary | ICD-10-CM

## 2024-01-31 DIAGNOSIS — Y92009 Unspecified place in unspecified non-institutional (private) residence as the place of occurrence of the external cause: Secondary | ICD-10-CM | POA: Insufficient documentation

## 2024-01-31 DIAGNOSIS — R42 Dizziness and giddiness: Secondary | ICD-10-CM

## 2024-01-31 NOTE — Progress Notes (Signed)
 Goals     . * Patient Goals (pt-stated)      Reduce salt        *Some images could not be shown.

## 2024-04-30 ENCOUNTER — Other Ambulatory Visit: Payer: Self-pay

## 2024-04-30 ENCOUNTER — Encounter (HOSPITAL_COMMUNITY): Payer: Self-pay

## 2024-04-30 ENCOUNTER — Emergency Department (HOSPITAL_COMMUNITY)

## 2024-04-30 ENCOUNTER — Inpatient Hospital Stay (HOSPITAL_COMMUNITY)
Admission: EM | Admit: 2024-04-30 | Discharge: 2024-04-30 | DRG: 641 | Discharge status: Against Medical Advice | Attending: Internal Medicine | Admitting: Internal Medicine

## 2024-04-30 DIAGNOSIS — F17210 Nicotine dependence, cigarettes, uncomplicated: Secondary | ICD-10-CM | POA: Diagnosis present

## 2024-04-30 DIAGNOSIS — E669 Obesity, unspecified: Secondary | ICD-10-CM | POA: Diagnosis present

## 2024-04-30 DIAGNOSIS — G894 Chronic pain syndrome: Secondary | ICD-10-CM | POA: Diagnosis present

## 2024-04-30 DIAGNOSIS — E118 Type 2 diabetes mellitus with unspecified complications: Secondary | ICD-10-CM

## 2024-04-30 DIAGNOSIS — W19XXXA Unspecified fall, initial encounter: Principal | ICD-10-CM

## 2024-04-30 DIAGNOSIS — E7800 Pure hypercholesterolemia, unspecified: Secondary | ICD-10-CM | POA: Diagnosis present

## 2024-04-30 DIAGNOSIS — Z91198 Patient's noncompliance with other medical treatment and regimen for other reason: Secondary | ICD-10-CM | POA: Diagnosis not present

## 2024-04-30 DIAGNOSIS — E119 Type 2 diabetes mellitus without complications: Secondary | ICD-10-CM | POA: Diagnosis present

## 2024-04-30 DIAGNOSIS — I2510 Atherosclerotic heart disease of native coronary artery without angina pectoris: Secondary | ICD-10-CM

## 2024-04-30 DIAGNOSIS — R451 Restlessness and agitation: Secondary | ICD-10-CM

## 2024-04-30 DIAGNOSIS — D72829 Elevated white blood cell count, unspecified: Secondary | ICD-10-CM | POA: Diagnosis present

## 2024-04-30 DIAGNOSIS — I10 Essential (primary) hypertension: Secondary | ICD-10-CM | POA: Diagnosis present

## 2024-04-30 DIAGNOSIS — Z1152 Encounter for screening for COVID-19: Secondary | ICD-10-CM

## 2024-04-30 DIAGNOSIS — Z7982 Long term (current) use of aspirin: Secondary | ICD-10-CM | POA: Diagnosis not present

## 2024-04-30 DIAGNOSIS — I4891 Unspecified atrial fibrillation: Secondary | ICD-10-CM

## 2024-04-30 DIAGNOSIS — W01198A Fall on same level from slipping, tripping and stumbling with subsequent striking against other object, initial encounter: Secondary | ICD-10-CM | POA: Diagnosis present

## 2024-04-30 DIAGNOSIS — I4819 Other persistent atrial fibrillation: Secondary | ICD-10-CM | POA: Diagnosis present

## 2024-04-30 DIAGNOSIS — Z7901 Long term (current) use of anticoagulants: Secondary | ICD-10-CM

## 2024-04-30 DIAGNOSIS — F32A Depression, unspecified: Secondary | ICD-10-CM | POA: Diagnosis present

## 2024-04-30 DIAGNOSIS — Z9861 Coronary angioplasty status: Secondary | ICD-10-CM | POA: Diagnosis not present

## 2024-04-30 DIAGNOSIS — Y92009 Unspecified place in unspecified non-institutional (private) residence as the place of occurrence of the external cause: Secondary | ICD-10-CM | POA: Diagnosis not present

## 2024-04-30 DIAGNOSIS — F10129 Alcohol abuse with intoxication, unspecified: Secondary | ICD-10-CM | POA: Diagnosis present

## 2024-04-30 DIAGNOSIS — E860 Dehydration: Secondary | ICD-10-CM | POA: Diagnosis present

## 2024-04-30 DIAGNOSIS — Z66 Do not resuscitate: Secondary | ICD-10-CM | POA: Diagnosis present

## 2024-04-30 DIAGNOSIS — R55 Syncope and collapse: Secondary | ICD-10-CM | POA: Diagnosis present

## 2024-04-30 DIAGNOSIS — Z79899 Other long term (current) drug therapy: Secondary | ICD-10-CM | POA: Diagnosis not present

## 2024-04-30 DIAGNOSIS — E8729 Other acidosis: Secondary | ICD-10-CM | POA: Diagnosis present

## 2024-04-30 DIAGNOSIS — S0101XA Laceration without foreign body of scalp, initial encounter: Secondary | ICD-10-CM | POA: Diagnosis present

## 2024-04-30 DIAGNOSIS — E872 Acidosis, unspecified: Secondary | ICD-10-CM | POA: Diagnosis present

## 2024-04-30 DIAGNOSIS — Z5329 Procedure and treatment not carried out because of patient's decision for other reasons: Secondary | ICD-10-CM | POA: Diagnosis present

## 2024-04-30 DIAGNOSIS — Z7985 Long-term (current) use of injectable non-insulin antidiabetic drugs: Secondary | ICD-10-CM

## 2024-04-30 DIAGNOSIS — F10920 Alcohol use, unspecified with intoxication, uncomplicated: Secondary | ICD-10-CM

## 2024-04-30 LAB — BASIC METABOLIC PANEL WITH GFR
Anion gap: 20 — ABNORMAL HIGH (ref 5–15)
BUN: 19 mg/dL (ref 8–23)
CO2: 17 mmol/L — ABNORMAL LOW (ref 22–32)
Calcium: 8.3 mg/dL — ABNORMAL LOW (ref 8.9–10.3)
Chloride: 101 mmol/L (ref 98–111)
Creatinine, Ser: 0.66 mg/dL (ref 0.61–1.24)
GFR, Estimated: >60 mL/min (ref >60)
Glucose, Bld: 72 mg/dL (ref 70–99)
Potassium: 4.4 mmol/L (ref 3.5–5.1)
Sodium: 138 mmol/L (ref 135–145)

## 2024-04-30 LAB — I-STAT CHEM 8, ED
BUN: 23 mg/dL (ref 8–23)
Calcium, Ion: 1.01 mmol/L — ABNORMAL LOW (ref 1.15–1.40)
Chloride: 103 mmol/L (ref 98–111)
Creatinine, Ser: 1.2 mg/dL (ref 0.61–1.24)
Glucose, Bld: 80 mg/dL (ref 70–99)
HCT: 41 % (ref 39.0–52.0)
Hemoglobin: 13.9 g/dL (ref 13.0–17.0)
Potassium: 3.7 mmol/L (ref 3.5–5.1)
Sodium: 141 mmol/L (ref 135–145)
TCO2: 21 mmol/L — ABNORMAL LOW (ref 22–32)

## 2024-04-30 LAB — CBC
HCT: 39.6 % (ref 39.0–52.0)
Hemoglobin: 13.3 g/dL (ref 13.0–17.0)
MCH: 31.2 pg (ref 26.0–34.0)
MCHC: 33.6 g/dL (ref 30.0–36.0)
MCV: 93 fL (ref 80.0–100.0)
Platelets: 194 K/uL (ref 150–400)
RBC: 4.26 MIL/uL (ref 4.22–5.81)
RDW: 15.6 % — ABNORMAL HIGH (ref 11.5–15.5)
WBC: 12.6 K/uL — ABNORMAL HIGH (ref 4.0–10.5)
nRBC: 0 % (ref 0.0–0.2)

## 2024-04-30 LAB — BLOOD GAS, VENOUS
Acid-base deficit: 4.5 mmol/L — ABNORMAL HIGH (ref 0.0–2.0)
Bicarbonate: 19 mmol/L — ABNORMAL LOW (ref 20.0–28.0)
O2 Saturation: 79.9 %
Patient temperature: 36.6
pCO2, Ven: 29 mmHg — ABNORMAL LOW (ref 44–60)
pH, Ven: 7.42 (ref 7.25–7.43)
pO2, Ven: 48 mmHg — ABNORMAL HIGH (ref 32–45)

## 2024-04-30 LAB — URINE DRUG SCREEN
Amphetamines: NEGATIVE
Barbiturates: NEGATIVE
Benzodiazepines: NEGATIVE
Cocaine: NEGATIVE
Fentanyl: NEGATIVE
Methadone Scn, Ur: NEGATIVE
Opiates: POSITIVE — AB
Tetrahydrocannabinol: NEGATIVE

## 2024-04-30 LAB — URINALYSIS, W/ REFLEX TO CULTURE (INFECTION SUSPECTED)
Bacteria, UA: NONE SEEN
Glucose, UA: NEGATIVE mg/dL
Ketones, ur: 40 mg/dL — AB
Leukocytes,Ua: NEGATIVE
Nitrite: NEGATIVE
Protein, ur: 30 mg/dL — AB
Specific Gravity, Urine: >1.03 — ABNORMAL HIGH (ref 1.005–1.030)
pH: 5.5 (ref 5.0–8.0)

## 2024-04-30 LAB — RESP PANEL BY RT-PCR (RSV, FLU A&B, COVID)  RVPGX2
Influenza A by PCR: NEGATIVE
Influenza B by PCR: NEGATIVE
Resp Syncytial Virus by PCR: NEGATIVE
SARS Coronavirus 2 by RT PCR: NEGATIVE

## 2024-04-30 LAB — I-STAT CG4 LACTIC ACID, ED
Lactic Acid, Venous: 6.8 mmol/L (ref 0.5–1.9)
Lactic Acid, Venous: 7.1 mmol/L (ref 0.5–1.9)
Lactic Acid, Venous: 6 mmol/L (ref 0.5–1.9)
Lactic Acid, Venous: 5.9 mmol/L (ref 0.5–1.9)

## 2024-04-30 LAB — COMPREHENSIVE METABOLIC PANEL WITH GFR
ALT: 24 U/L (ref 0–44)
AST: 40 U/L (ref 15–41)
Albumin: 3.9 g/dL (ref 3.5–5.0)
Alkaline Phosphatase: 109 U/L (ref 38–126)
Anion gap: 20 — ABNORMAL HIGH (ref 5–15)
BUN: 22 mg/dL (ref 8–23)
CO2: 21 mmol/L — ABNORMAL LOW (ref 22–32)
Calcium: 8.8 mg/dL — ABNORMAL LOW (ref 8.9–10.3)
Chloride: 100 mmol/L (ref 98–111)
Creatinine, Ser: 0.91 mg/dL (ref 0.61–1.24)
GFR, Estimated: >60 mL/min (ref >60)
Glucose, Bld: 79 mg/dL (ref 70–99)
Potassium: 3.9 mmol/L (ref 3.5–5.1)
Sodium: 141 mmol/L (ref 135–145)
Total Bilirubin: 1.7 mg/dL — ABNORMAL HIGH (ref 0.0–1.2)
Total Protein: 6.4 g/dL — ABNORMAL LOW (ref 6.5–8.1)

## 2024-04-30 LAB — PROTIME-INR
INR: 1.4 — ABNORMAL HIGH (ref 0.8–1.2)
Prothrombin Time: 17.7 s — ABNORMAL HIGH (ref 11.4–15.2)

## 2024-04-30 LAB — SAMPLE TO BLOOD BANK

## 2024-04-30 LAB — HIV ANTIBODY (ROUTINE TESTING W REFLEX): HIV Screen 4th Generation wRfx: NONREACTIVE

## 2024-04-30 LAB — CK: Total CK: 155 U/L (ref 49–397)

## 2024-04-30 LAB — ETHANOL: Alcohol, Ethyl (B): 339 mg/dL (ref <15)

## 2024-04-30 LAB — TSH: TSH: 0.917 u[IU]/mL (ref 0.350–4.500)

## 2024-04-30 LAB — MAGNESIUM: Magnesium: 1.9 mg/dL (ref 1.7–2.4)

## 2024-04-30 LAB — PRO BRAIN NATRIURETIC PEPTIDE: Pro Brain Natriuretic Peptide: 265 pg/mL (ref <300.0)

## 2024-04-30 MED ORDER — LORAZEPAM 2 MG/ML IJ SOLN
1.0000 mg | Freq: Once | INTRAMUSCULAR | Status: AC
  Administered 2024-04-30: 11:00:00 1 mg via INTRAVENOUS
  Filled 2024-04-30: qty 1

## 2024-04-30 MED ORDER — METOPROLOL TARTRATE 25 MG PO TABS: 25.0000 mg | ORAL_TABLET | Freq: Four times a day (QID) | ORAL | Status: DC

## 2024-04-30 MED ORDER — FOLIC ACID 1 MG PO TABS: 1.0000 mg | ORAL_TABLET | Freq: Every day | ORAL | 0 refills | Status: AC

## 2024-04-30 MED ORDER — THIAMINE HCL 100 MG/ML IJ SOLN
100.0000 mg | INTRAMUSCULAR | Status: DC
  Filled 2024-04-30: qty 2

## 2024-04-30 MED ORDER — METOPROLOL TARTRATE 25 MG PO TABS
50.0000 mg | ORAL_TABLET | Freq: Four times a day (QID) | ORAL | Status: DC
  Administered 2024-04-30: 18:00:00 50 mg via ORAL
  Filled 2024-04-30: qty 2

## 2024-04-30 MED ORDER — AMIODARONE HCL IN DEXTROSE 360-4.14 MG/200ML-% IV SOLN
60.0000 mg/h | INTRAVENOUS | Status: DC
  Administered 2024-04-30: 16:00:00 60 mg/h via INTRAVENOUS
  Filled 2024-04-30: qty 200

## 2024-04-30 MED ORDER — ADULT MULTIVITAMIN W/MINERALS CH: 1.0000 | ORAL_TABLET | Freq: Every day | ORAL | Status: DC

## 2024-04-30 MED ORDER — THIAMINE HCL 100 MG PO TABS: 100.0000 mg | ORAL_TABLET | Freq: Every day | ORAL | 0 refills | Status: AC

## 2024-04-30 MED ORDER — LACTATED RINGERS IV BOLUS
1000.0000 mL | Freq: Once | INTRAVENOUS | Status: AC
  Administered 2024-04-30: 12:00:00 1000 mL via INTRAVENOUS

## 2024-04-30 MED ORDER — AMIODARONE IV BOLUS ONLY 150 MG/100ML
150.0000 mg | Freq: Once | INTRAVENOUS | Status: AC
  Administered 2024-04-30: 15:00:00 150 mg via INTRAVENOUS
  Filled 2024-04-30: qty 100

## 2024-04-30 MED ORDER — HALOPERIDOL LACTATE 5 MG/ML IJ SOLN
2.0000 mg | Freq: Once | INTRAMUSCULAR | Status: AC
  Administered 2024-04-30: 13:00:00 2 mg via INTRAVENOUS
  Filled 2024-04-30: qty 1

## 2024-04-30 MED ORDER — LORAZEPAM 2 MG/ML IJ SOLN: 1.0000 mg | INTRAMUSCULAR | Status: DC | PRN

## 2024-04-30 MED ORDER — SODIUM CHLORIDE 0.9 % IV SOLN
2.0000 g | Freq: Once | INTRAVENOUS | Status: AC
  Administered 2024-04-30: 12:00:00 2 g via INTRAVENOUS
  Filled 2024-04-30: qty 12.5

## 2024-04-30 MED ORDER — LORAZEPAM 1 MG PO TABS: 1.0000 mg | ORAL_TABLET | ORAL | Status: DC | PRN

## 2024-04-30 MED ORDER — SODIUM CHLORIDE 0.9 % IV SOLN
100.0000 mg | Freq: Once | INTRAVENOUS | Status: AC
  Administered 2024-04-30: 17:00:00 100 mg via INTRAVENOUS
  Filled 2024-04-30: qty 100

## 2024-04-30 MED ORDER — LACTATED RINGERS IV BOLUS
1000.0000 mL | Freq: Once | INTRAVENOUS | Status: AC
  Administered 2024-04-30: 15:00:00 1000 mL via INTRAVENOUS

## 2024-04-30 MED ORDER — IOHEXOL 350 MG/ML SOLN
75.0000 mL | Freq: Once | INTRAVENOUS | Status: AC | PRN
  Administered 2024-04-30: 11:00:00 75 mL via INTRAVENOUS

## 2024-04-30 MED ORDER — FOLIC ACID 1 MG PO TABS: 1.0000 mg | ORAL_TABLET | Freq: Every day | ORAL | Status: DC

## 2024-04-30 MED ORDER — AMIODARONE HCL IN DEXTROSE 360-4.14 MG/200ML-% IV SOLN: 30.0000 mg/h | INTRAVENOUS | Status: DC

## 2024-04-30 NOTE — ED Triage Notes (Signed)
See trauma note

## 2024-04-30 NOTE — ED Notes (Signed)
 Sitter at bedside.

## 2024-04-30 NOTE — ED Notes (Signed)
 MD notified that doxycycline  not started due to failed IV start. IV consult placed.

## 2024-04-30 NOTE — Discharge Instructions (Addendum)
 Please return to the hospital if you have shortness of breath, fast heart rate, palpitations, chest pain, more lightheadedness, worsening bleeding form the scalp, syncope, loss of consciousness, seizures, withdrawal from alcohol including hallucinations, tremors, and high heart rate.   Please do not take the eliquis due to the head trauma and active bleeding form the scalp. You may resume this within 7 days or sooner if instructed by your PCP. If you continue to fall and hit your head, do NOT take eliquis until you have been evaluated by a doctor.   Please pause on the monjaro as well as you are not eating enough which is also contributing to your sickness. Talk with your PCP about this ASAP.

## 2024-04-30 NOTE — ED Notes (Signed)
 MD at bedside.

## 2024-04-30 NOTE — ED Notes (Signed)
 No sitters at this time per staffing

## 2024-04-30 NOTE — ED Provider Notes (Incomplete)
 [place] [facility] AT [facility] Provider Note   CSN: [id number] Arrival date & time: 04/30/24  1014     History {Add pertinent medical, surgical, social history, OB history to HPI:1} Chief Complaint  Patient presents with   Fall    Level 2    Head Injury    Peter Mccann is a 68 y.o. male with PMH as listed below who presents brought in by EMS as a level 2 trauma from his home where he lives with his son and his wife.  Patient is ANO x 2-3, intermittently agitated and refusing care.  He reportedly drank more than 1/5 of liquor today and has been falling.  He has been falling repeatedly over the last several weeks.  He has large laceration on his frontal scalp that reportedly occurred 1 week ago.  He fell twice this morning and worsened the laceration.  EMS notes that it took them 45 minutes on scene to get him to cooperate to get onto the stretcher.  He complains of pain in his lower back as well as in his bilateral hips.  On first look he is noted to have a large hematoma on his right hip and lateral right thigh.  He is moving all of his extremities.  Does take blood thinners.   Past Medical History:  Diagnosis Date   Abdominal pain    Alcohol abuse    Anemia    Arthritis    B12 deficiency    BMI 40.0-44.9, adult (HCC)    Depression    Diabetes (HCC)    High cholesterol    Hypertension    Plaque psoriasis    Substance abuse (HCC)        Home Medications Prior to Admission medications  Medication Sig Start Date End Date Taking? Authorizing Provider  apixaban (ELIQUIS) 5 MG TABS tablet Take 5 mg by mouth every 12 (twelve) hours. 02/21/23   [provider]  aspirin  81 MG chewable tablet Chew 1 tablet (81 mg total) by mouth daily. 11/07/18   Paraschos, Alexander, MD  Aspirin -Acetaminophen -Caffeine (GOODY HEADACHE PO) Take 1 Package by mouth daily as needed (Headache).    [provider]  atorvastatin  (LIPITOR ) 80 MG tablet Take  80 mg by mouth daily. 06/19/16   [provider]  carvedilol  (COREG ) 25 MG tablet Take 12.5 mg by mouth 2 (two) times daily. 05/30/16   [provider]  clobetasol (TEMOVATE) 0.05 % external solution Apply 1 Application topically once a week. 02/07/22   [provider]  clobetasol ointment (TEMOVATE) 0.05 % Apply 1 Application topically once a week. 02/07/22   [provider]  Clobetasol Propionate 0.05 % shampoo Apply 1 application  topically 2 (two) times a week. 02/07/22   [provider]  DULoxetine (CYMBALTA) 60 MG capsule Take 60 mg by mouth every evening. 02/06/23   [provider]  HYDROcodone-acetaminophen  (NORCO) 7.5-325 MG tablet Take 1 tablet by mouth every 6 (six) hours as needed for moderate pain (pain score 4-6). 03/01/23   [provider]  Magnesium  400 MG CAPS Take 400 mg by mouth daily.    [provider]  MOUNJARO 7.5 MG/0.5ML Pen Inject 7.5 mg into the skin once a week. 03/16/23   [provider]  Multiple Vitamins-Minerals (MULTIVITAMIN WITH MINERALS) tablet Take 1 tablet by mouth daily. men    [provider]  OVER THE COUNTER MEDICATION Take 1 tablet by mouth every evening. super beta prostate  [provider]  pantoprazole  (PROTONIX ) 40 MG tablet Take 40 mg by mouth 2 (two) times daily.    [provider]  potassium chloride  (K-DUR) 10 MEQ tablet Take 10 mEq by mouth daily. 04/15/18   [provider]  testosterone (ANDROGEL) 50 MG/5GM (1%) GEL Place 5 g onto the skin daily. 01/26/23   [provider]  traZODone  (DESYREL ) 50 MG tablet Take 150 mg by mouth at bedtime.    [provider]      Allergies    Metformin and related    Review of Systems   Review of Systems A 10 point review of systems was performed and is negative unless otherwise reported in HPI.  Physical Exam Updated Vital Signs BP 134/86   Pulse (!) 128   Temp 97.9 F (36.6 C)  (Oral)   Resp 19   Ht 5' 9 (1.753 m)   Wt 82.1 kg   SpO2 100%   BMI 26.73 kg/m  Physical Exam  PRIMARY SURVEY  Airway Airway intact  Breathing Bilateral breath sounds  Circulation Carotid/femoral pulses 2+ intact bilaterally  GCS E =  *** V =  *** M =  *** Total = ***  Environment All clothes removed      SECONDARY SURVEY  Gen: -NAD  HEENT: -Head: NCAT. Scalp is clear of lacerations or wounds. Skull is clear of deformities or depressions -Forehead: Normal -Midface: Stable -Eyes: No visible injury to eyelids or eye, PERRL, EOMI -Nose: No gross deformities, no septal hematoma -Mouth: No injuries to lips, tongue or teeth. No trismus or malposition -Ears: No hemotympanum, no auricular hematoma -Neck: Trachea is midline, no distended neck veins  Chest: -No tenderness, deformities, bruising or crepitus to clavicles or chest -Normal chest expansion -Normal heart sounds, S1/S2 normal, no m/r/g -No wheezes, rales, rhonchi  Abdomen: -No tenderness, bruising or penetrating injury  Pelvis: -Pelvis is stable and non-tender  Genital:  -No gross deformity or visible injury to perineum or external genitalia -No blood at urethral meatus -No incontinence  Extremities: Right Upper Extremity: -No point tenderness, deformity or other signs of injury -Radial pulse intact RUE, cap refill good -Normal sensation -Normal ROM, good strength Left Upper Extremity: -No point tenderness, deformity or other signs of injury -Radial pulse intact LUE, cap refill good -Normal sensation -Normal ROM, good strength Right Lower Extremity: -No point tenderness, deformity or other signs of injury -DP intact RLE -Normal sensation -Normal ROM, good strength Left Lower Extremity: -No point tenderness, deformity or other signs of injury -DP intact LLE -Normal sensation -Normal ROM, good strength  Back/Spine: -*** tenderness or step-offs -Rectal: good tone, no gross blood -Collar: EMS collar in place  and exchanged for ***.  Other: N/A     ED Results / Procedures / Treatments   Labs (all labs ordered are listed, but only abnormal results are displayed) Labs Reviewed  COMPREHENSIVE METABOLIC PANEL WITH GFR - Abnormal; Notable for the following components:      Result Value   CO2 21 (*)    Calcium  8.8 (*)    Total Protein 6.4 (*)    Total Bilirubin 1.7 (*)    Anion gap 20 (*)    All other components within normal limits  CBC - Abnormal; Notable for the following components:   WBC 12.6 (*)    RDW 15.6 (*)    All other components within normal limits  ETHANOL - Abnormal; Notable for the following components:   Alcohol, Ethyl (B) 339 (*)  All other components within normal limits  PROTIME-INR - Abnormal; Notable for the following components:   Prothrombin Time 17.7 (*)    INR 1.4 (*)    All other components within normal limits  URINE DRUG SCREEN - Abnormal; Notable for the following components:   Opiates POSITIVE (*)    All other components within normal limits  URINALYSIS, W/ REFLEX TO CULTURE (INFECTION SUSPECTED) - Abnormal; Notable for the following components:   Specific Gravity, Urine >1.030 (*)    Hgb urine dipstick LARGE (*)    Bilirubin Urine SMALL (*)    Ketones, ur 40 (*)    Protein, ur 30 (*)    All other components within normal limits  BLOOD GAS, VENOUS - Abnormal; Notable for the following components:   pCO2, Ven 29 (*)    pO2, Ven 48 (*)    Bicarbonate 19.0 (*)    Acid-base deficit 4.5 (*)    All other components within normal limits  I-STAT CHEM 8, ED - Abnormal; Notable for the following components:   Calcium , Ion 1.01 (*)    TCO2 21 (*)    All other components within normal limits  I-STAT CG4 LACTIC ACID, ED - Abnormal; Notable for the following components:   Lactic Acid, Venous 6.8 (*)    All other components within normal limits  I-STAT CG4 LACTIC ACID, ED - Abnormal; Notable for the following components:   Lactic Acid, Venous 7.1 (*)    All  other components within normal limits  I-STAT CG4 LACTIC ACID, ED - Abnormal; Notable for the following components:   Lactic Acid, Venous 6.0 (*)    All other components within normal limits  I-STAT CG4 LACTIC ACID, ED - Abnormal; Notable for the following components:   Lactic Acid, Venous 5.9 (*)    All other components within normal limits  RESP PANEL BY RT-PCR (RSV, FLU A&B, COVID)  [id number]  CULTURE, BLOOD (ROUTINE X 2)  CULTURE, BLOOD (ROUTINE X 2)  PRO BRAIN NATRIURETIC PEPTIDE  MAGNESIUM   TSH  CK  BETA-HYDROXYBUTYRIC ACID  BASIC METABOLIC PANEL WITH GFR  HIV ANTIBODY (ROUTINE TESTING W REFLEX)  I-STAT CG4 LACTIC ACID, ED  SAMPLE TO BLOOD BANK    EKG EKG Interpretation Date/Time:  Wednesday April 30 2024 13:44:50 EST Ventricular Rate:  136 PR Interval:    QRS Duration:  89 QT Interval:  308 QTC Calculation: 464 R Axis:   65  Text Interpretation: Atrial fibrillation Borderline repolarization abnormality Confirmed by Franklyn Gills [phone]) on 04/30/2024 1:48:48 PM  Radiology DG Chest 1 View Result Date: 04/30/2024 CLINICAL DATA:  Fall EXAM: CHEST  1 VIEW COMPARISON:  None Available. FINDINGS: The heart size and mediastinal contours are within normal limits. Both lungs are clear. The visualized skeletal structures are unremarkable. IMPRESSION: No active disease. Electronically Signed   By: Lynwood Landy Raddle M.D.   On: 04/30/2024 11:58   DG Femur Min 2 Views Right Result Date: 04/30/2024 CLINICAL DATA:  Fall EXAM: RIGHT FEMUR 2 VIEWS COMPARISON:  None Available. FINDINGS: There is no evidence of fracture or other focal bone lesions. Soft tissues are unremarkable. IMPRESSION: Negative. Electronically Signed   By: Lynwood Landy Raddle M.D.   On: 04/30/2024 11:57   DG Pelvis 1-2 Views Result Date: 04/30/2024 CLINICAL DATA:  Fall EXAM: PELVIS - 1-2 VIEW COMPARISON:  None Available. FINDINGS: There is no evidence of pelvic fracture or diastasis. No pelvic bone lesions are seen.  IMPRESSION: Negative. Electronically Signed   By: Lynwood  Landy Raddle M.D.   On: 04/30/2024 11:57   CT L-SPINE NO CHARGE Result Date: 04/30/2024 EXAM: CT OF THE LUMBAR SPINE WITHOUT CONTRAST 04/30/2024 11:08:07 AM TECHNIQUE: CT of the lumbar spine was performed without the administration of intravenous contrast. Multiplanar reformatted images are provided for review. Automated exposure control, iterative reconstruction, and/or weight based adjustment of the mA/kV was utilized to reduce the radiation dose to as low as reasonably achievable. COMPARISON: None available. CLINICAL HISTORY: FINDINGS: BONES AND ALIGNMENT: The lumbar vertebrae maintain the height and alignment. No acute fracture or suspicious bone lesion. DEGENERATIVE CHANGES: There is moderate chronic facet arthrosis at L4-L5 and L5-S1. SOFT TISSUES: No acute abnormality. IMPRESSION: 1. Moderate chronic facet arthrosis at L4-5 and L5-S1. Electronically signed by: Evalene Coho MD 04/30/2024 11:37 AM EST RP Workstation: [id number]   CT T-SPINE NO CHARGE Result Date: 04/30/2024 EXAM: CT THORACIC SPINE WITH CONTRAST 04/30/2024 11:08:07 AM TECHNIQUE: CT of the thoracic spine was performed with the administration of 75 mL of iohexol  (OMNIPAQUE ) 350 MG/ML injection. Multiplanar reformatted images are provided for review. Automated exposure control, iterative reconstruction, and/or weight based adjustment of the mA/kV was utilized to reduce the radiation dose to as low as reasonably achievable. COMPARISON: None available. CLINICAL HISTORY: FINDINGS: BONES AND ALIGNMENT: Normal vertebral body heights. No acute fracture or suspicious bone lesion. Normal alignment. DEGENERATIVE CHANGES: There is multilevel degenerative disc disease within the thoracic spine. SOFT TISSUES: No acute abnormality. IMPRESSION: 1. No acute abnormality of the thoracic spine. 2. Multilevel degenerative disc disease within the thoracic spine. Electronically signed by: Evalene Coho  MD 04/30/2024 11:36 AM EST RP Workstation: [id number]   CT CHEST ABDOMEN PELVIS W CONTRAST Result Date: 04/30/2024 EXAM: CT CHEST, ABDOMEN AND PELVIS WITH CONTRAST 04/30/2024 11:08:07 AM TECHNIQUE: CT of the chest, abdomen and pelvis was performed with the administration of 75 mL of iohexol  (OMNIPAQUE ) 350 MG/ML injection. Multiplanar reformatted images are provided for review. Automated exposure control, iterative reconstruction, and/or weight based adjustment of the mA/kV was utilized to reduce the radiation dose to as low as reasonably achievable. COMPARISON: None available. CLINICAL HISTORY: Polytrauma, blunt. FINDINGS: CHEST: MEDIASTINUM AND LYMPH NODES: Heart and pericardium are unremarkable. Moderate calcific coronary artery disease. The central airways are clear. The thoracic aorta demonstrates mild calcific atheromatous disease. No mediastinal, hilar or axillary lymphadenopathy. LUNGS AND PLEURA: There are ground-glass and reticular opacities present dependently within the lungs bilaterally, likely representing combination of mild edema and atelectasis. No pleural effusion or pneumothorax. ABDOMEN AND PELVIS: LIVER: The liver is unremarkable. GALLBLADDER AND BILE DUCTS: Gallbladder is unremarkable. No biliary ductal dilatation. SPLEEN: No acute abnormality. PANCREAS: No acute abnormality. ADRENAL GLANDS: No acute abnormality. KIDNEYS, URETERS AND BLADDER: No stones in the kidneys or ureters. No hydronephrosis. No perinephric or periureteral stranding. Urinary bladder is unremarkable. GI AND BOWEL: Stomach demonstrates no acute abnormality. There are numerous colonic diverticula, but no evidence of diverticulitis. The appendix is not clearly identified. There is no bowel obstruction. REPRODUCTIVE ORGANS: No acute abnormality. PERITONEUM AND RETROPERITONEUM: No ascites. No free air. VASCULATURE: The abdominal aorta is normal in caliber and demonstrates mild calcific atheromatous disease. ABDOMINAL AND  PELVIS LYMPH NODES: No lymphadenopathy. REPRODUCTIVE ORGANS: No acute abnormality. BONES AND SOFT TISSUES: The bones are intact. There are mild degenerative changes within the lumbar spine and hips. No focal soft tissue abnormality. IMPRESSION: 1. No acute findings in the chest, abdomen, and pelvis related to polytrauma. 2. Dependent ground-glass and reticular opacities in the lungs bilaterally, likely representing mild edema  and atelectasis. 3. Moderate calcific coronary artery disease and mild calcific atheromatous disease in the thoracic and abdominal aorta. 4. Numerous colonic diverticula without evidence of diverticulitis. Electronically signed by: Evalene Coho MD 04/30/2024 11:34 AM EST RP Workstation: [id number]   CT CERVICAL SPINE WO CONTRAST Result Date: 04/30/2024 EXAM: CT CERVICAL SPINE WITHOUT CONTRAST 04/30/2024 11:08:07 AM TECHNIQUE: CT of the cervical spine was performed without the administration of intravenous contrast. Multiplanar reformatted images are provided for review. Automated exposure control, iterative reconstruction, and/or weight based adjustment of the mA/kV was utilized to reduce the radiation dose to as low as reasonably achievable. COMPARISON: None available. CLINICAL HISTORY: Polytrauma, blunt. FINDINGS: BONES AND ALIGNMENT: There is no evidence of fracture or acute traumatic injury. DEGENERATIVE CHANGES: There is chronic degenerative disc disease present at C3-C4, C5-C6 and C6-C7, with posterior disc bulging and endplate ridging causing mild-to-moderate central spinal canal stenosis at each level. There is also moderate diffuse bilateral facet arthrosis. The right facets at C2-C3 are fused. SOFT TISSUES: No prevertebral soft tissue swelling. IMPRESSION: 1. No evidence of fracture or acute traumatic injury. 2. Chronic degenerative disc disease at C3-4, C5-6, and C6-7 with posterior disc bulging/endplate ridging causing mild-to-moderate central spinal canal stenosis at each  level. 3. Moderate diffuse bilateral facet arthrosis. Right facets at C2-3 are fused. Electronically signed by: Evalene Coho MD 04/30/2024 11:29 AM EST RP Workstation: [id number]   CT HEAD WO CONTRAST Result Date: 04/30/2024 EXAM: CT HEAD WITHOUT CONTRAST 04/30/2024 11:08:07 AM TECHNIQUE: CT of the head was performed without the administration of intravenous contrast. Automated exposure control, iterative reconstruction, and/or weight based adjustment of the mA/kV was utilized to reduce the radiation dose to as low as reasonably achievable. COMPARISON: 01/31/2024 CLINICAL HISTORY: Head trauma, moderate-severe. FINDINGS: BRAIN AND VENTRICLES: No acute hemorrhage. No evidence of acute infarct. No hydrocephalus. No extra-axial collection. No mass effect or midline shift. ORBITS: No acute abnormality. SINUSES: No acute abnormality. SOFT TISSUES AND SKULL: Left scalp soft tissue swelling and laceration. No skull fracture. IMPRESSION: 1. No acute intracranial abnormality. 2. Left scalp soft tissue swelling and laceration. Electronically signed by: Evalene Coho MD 04/30/2024 11:26 AM EST RP Workstation: [id number]    Procedures Procedures  {Document cardiac monitor, telemetry assessment procedure when appropriate:1}  Medications Ordered in ED Medications  doxycycline  (VIBRAMYCIN ) 100 mg in sodium chloride  0.9 % 250 mL IVPB (100 mg Intravenous New Bag/Given 04/30/24 1712)  thiamine  (VITAMIN B1) injection 100 mg (has no administration in time range)  metoprolol  tartrate (LOPRESSOR ) tablet 50 mg (has no administration in time range)  LORazepam  (ATIVAN ) injection 1 mg (1 mg Intravenous Given 04/30/24 1039)  lactated ringers  bolus 1,000 mL (0 mLs Intravenous Stopped 04/30/24 1432)  ceFEPIme  (MAXIPIME ) 2 g in sodium chloride  0.9 % 100 mL IVPB (0 g Intravenous Stopped 04/30/24 1245)  iohexol  (OMNIPAQUE ) 350 MG/ML injection 75 mL (75 mLs Intravenous Contrast Given 04/30/24 1109)  haloperidol  lactate  (HALDOL ) injection 2 mg (2 mg Intravenous Given 04/30/24 1301)  amiodarone  (NEXTERONE ) 1.5 mg/mL IV bolus only 150 mg (0 mg Intravenous Stopped 04/30/24 1448)  lactated ringers  bolus 1,000 mL (0 mLs Intravenous Stopped 04/30/24 1544)    ED Course/ Medical Decision Making/ A&P                          Medical Decision Making Amount and/or Complexity of Data Reviewed Labs: ordered. Decision-making details documented in ED Course. Radiology: ordered. Decision-making details documented in ED Course.  Risk Prescription  drug management. Decision regarding hospitalization.    This patient presents to the ED for concern of ***, this involves an extensive number of treatment options, and is a complaint that carries with it a high risk of complications and morbidity.  I considered the following differential and admission for this acute, potentially life threatening condition.   MDM:    ***  Clinical Course as of 04/30/24 1720  Wed Apr 30, 2024  1042 Lactic Acid, Venous(!!): 6.8 [HN]  1044 WBC(!): 12.6 [HN]  1045 Calling code sepsis, giving IV fluids, getting blood cultures, and giving IV cefepime  [HN]  1045 Giving IV ativan  to get him to CT scanner [HN]  1134 CT CERVICAL SPINE WO CONTRAST 1. No evidence of fracture or acute traumatic injury. 2. Chronic degenerative disc disease at C3-4, C5-6, and C6-7 with posterior disc bulging/endplate ridging causing mild-to-moderate central spinal canal stenosis at each level. 3. Moderate diffuse bilateral facet arthrosis. Right facets at C2-3 are fused.   [HN]  1134 CT HEAD WO CONTRAST 1. No acute intracranial abnormality. 2. Left scalp soft tissue swelling and laceration.   [HN]  1147 CT T-SPINE NO CHARGE 1. No acute abnormality of the thoracic spine. 2. Multilevel degenerative disc disease within the thoracic spine.   [HN]  1147 CT CHEST ABDOMEN PELVIS W CONTRAST 1. No acute findings in the chest, abdomen, and pelvis related to  polytrauma. 2. Dependent ground-glass and reticular opacities in the lungs bilaterally, likely representing mild edema and atelectasis. 3. Moderate calcific coronary artery disease and mild calcific atheromatous disease in the thoracic and abdominal aorta. 4. Numerous colonic diverticula without evidence of diverticulitis.   [HN]  1147 Hemoglobin: 13.3 wnl [HN]  1234 Alcohol, Ethyl (B)(!!): 339 [HN]  1235 DG Femur Min 2 Views Right Negative [HN]  1253 Patient ripping IVs out, uncooperative, agitated, wanting to leave. Cannot leave as he is intoxicated w/ incomplete w/u.  QTc 440 ms. Will give 2mg  IV haldol  in addition to the 1 mg IV ativan  given earlier. [HN]  1317 Patient still trying to leave. Discussed with patient that if someone can come act on his behalf and take responsibility for him he could potentially leave. He is trying to call his wife.  [HN]  1348 Resp panel by RT-PCR (RSV, Flu A&B, Covid) Anterior Nasal Swab neg [HN]  1352 Lactic Acid, Venous(!!): 7.1 Patient had ripped his Ivs out and now is more calm after haldol , getting fluids now. Will repeat. [HN]  1430 D/w April w/ cardiology who will come to see the patient. [HN]  1434 Pulse Rate(!): 128 W/ BP ~100 mmHg will start amiodarone . Discussed with ED pharmacist who stated safe w/ haldol . [HN]  1502 BP(!): 85/53 After amiodarone  bolus, will give fluids [HN]  1528 Pressure now SBP 114 mmHg after additional fluids. Starting amiodarone  infusion. Will consult to medicine. Cardiology following. [HN]  1531 Lactic Acid, Venous(!!): 6.0 decreasing [HN]  1531 Patient SIRS+, obtained blood cultures, given IV fluids and IV cefepime /doxycycline  d/t dependent GGOs in lungs bilaterally likely representing edema/atelectasis but in s/o SIRS given abx as well. He does have open laceration on his scalp that has been present reportedly x 7 days, but no indication of severe cellulitis in this area that would be contributing to SIRS.  [HN]     Clinical Course User Index [HN] Franklyn Sid SAILOR, MD    Labs: I Ordered, and personally interpreted labs.  The pertinent results include:  ***  Imaging Studies ordered: I ordered imaging studies  including *** I independently visualized and interpreted imaging. I agree with the radiologist interpretation  Additional history obtained from ***.  External records from outside source obtained and reviewed including ***  Cardiac Monitoring: The patient was maintained on a cardiac monitor.  I personally viewed and interpreted the cardiac monitored which showed an underlying rhythm of: ***  Reevaluation: After the interventions noted above, I reevaluated the patient and found that they have :{resolved/improved/worsened:23923::improved}  Social Determinants of Health: ***  Disposition:  ***  Co morbidities that complicate the patient evaluation  Past Medical History:  Diagnosis Date   Abdominal pain    Alcohol abuse    Anemia    Arthritis    B12 deficiency    BMI 40.0-44.9, adult (HCC)    Depression    Diabetes (HCC)    High cholesterol    Hypertension    Plaque psoriasis    Substance abuse (HCC)      Medicines Meds ordered this encounter  Medications   LORazepam  (ATIVAN ) injection 1 mg   lactated ringers  bolus 1,000 mL   ceFEPIme  (MAXIPIME ) 2 g in sodium chloride  0.9 % 100 mL IVPB    Antibiotic Indication::   Other Indication (list below)    Other Indication::   Unknown Source.   iohexol  (OMNIPAQUE ) 350 MG/ML injection 75 mL   haloperidol  lactate (HALDOL ) injection 2 mg   amiodarone  (NEXTERONE ) 1.5 mg/mL IV bolus only 150 mg   lactated ringers  bolus 1,000 mL   doxycycline  (VIBRAMYCIN ) 100 mg in sodium chloride  0.9 % 250 mL IVPB    Antibiotic Indication::   CAP   DISCONTD: amiodarone  (NEXTERONE  PREMIX) 360-4.14 MG/200ML-% (1.8 mg/mL) IV infusion   DISCONTD: amiodarone  (NEXTERONE  PREMIX) 360-4.14 MG/200ML-% (1.8 mg/mL) IV infusion   thiamine  (VITAMIN B1) injection  100 mg   DISCONTD: metoprolol  tartrate (LOPRESSOR ) tablet 25 mg   metoprolol  tartrate (LOPRESSOR ) tablet 50 mg    I have reviewed the patients home medicines and have made adjustments as needed  Problem List / ED Course: Problem List Items Addressed This Visit       Cardiovascular and Mediastinum   Atrial fibrillation with rapid ventricular response (HCC)   Relevant Medications   metoprolol  tartrate (LOPRESSOR ) tablet 50 mg   Other Visit Diagnoses       Fall in home, initial encounter    -  Primary     Alcoholic intoxication without complication         Agitation         Scalp laceration, initial encounter                {Document critical care time when appropriate:1} {Document review of labs and clinical decision tools ie heart score, Chads2Vasc2 etc:1}  {Document your independent review of radiology images, and any outside records:1} {Document your discussion with family members, caretakers, and with consultants:1} {Document social determinants of health affecting pt's care:1} {Document your decision making why or why not admission, treatments were needed:1}  This note was created using dictation software, which may contain spelling or grammatical errors.

## 2024-04-30 NOTE — ED Triage Notes (Addendum)
 BIB EMS/ fall on thinners/ fall 7 days ago, with an open head injury/ new head injury today/hit head on bathroom counter/ pt is intoxicated/ last drink 0700 this am / pt is alert and oriented x4/ slurred speech

## 2024-04-30 NOTE — Progress Notes (Signed)
 Pt told rn he has somone to pick him up at 1800 and wishes to leave ama. Pt asked to let care complete. Bender DO contacted via secure chat about pts wish due to unstable condition.

## 2024-04-30 NOTE — ED Notes (Addendum)
 After PT and provider discussion about his care, RN brought pt a sandwich per bedside conversation with Bender DO.Pt asked RN to remove IV from his arms because I am leaving or should I pull it out after PT removed all of his cardiac leads and other equipment. RN asked if he would stay for antibiotic and he refused completion of drug.RN asked PT to sign AMA paper work if he wished to leave and told that per his earlier conversation with provider he should stay due to his unstable condition and large laceration on head. Pt refused  to stay and signed ama paper work after being reviewed. Pt stated he had possibly left some belonging in the other room. RN offered to get his belongings aftrer the completion. Pt agreed to stay long enough for RN  to search for belongings.After signing paper work and while pt was going to programmer, multimedia, daughter arrived. Daughter, who appeared agitated began asking various RN staff why the hospital was releasing her father, Daughter informed that pt had AMA 'd against the medical advice of the staff. When inquired why nothing had been done for him, like this wound on his head and no perceptions or instructions. RN offered to call providers to bedside  and asked pt to wait for printing of paperwork,etc. However PT and family refused to wait daughter stating I drove thru hell to get here and we don't have time to wait and pt saying  I have been here all day I why did they not do this earlier.

## 2024-04-30 NOTE — Progress Notes (Signed)
 Transition of Care [facility]) - CAGE-AID Screening   Patient Details  Name: Peter Mccann MRN: [id number] Date of Birth: 1955/07/10  Darice [name], RN Trauma Response Nurse Phone Number: [phone] 04/30/2024, 1:45 PM  CAGE-AID Screening:    Have You Ever Felt You Ought to Cut Down on Your Drinking or Drug Use?: Yes Have People Annoyed You By Critizing Your Drinking Or Drug Use?: Yes Have You Felt Bad Or Guilty About Your Drinking Or Drug Use?: Yes Have You Ever Had a Drink or Used Drugs First Thing In The Morning to Steady Your Nerves or to Get Rid of a Hangover?: Yes CAGE-AID Score: 4  Substance Abuse Education Offered: (S) Yes (pt has an ETOH level of 331 on arrival to ED. Admits to drinking a fifth of vodka a day- states he has already drank a 1/2 of a fifth this am. Will need services on discharge- Has been sober for 5-6 yrs in the past - per pt.)

## 2024-04-30 NOTE — Consult Note (Addendum)
 Cardiology Consultation   Patient ID: Peter Mccann MRN: [id number]; DOB: Apr 04, 1956  Admit date: 04/30/2024 Date of Consult: 04/30/2024  PCP:  Fernande Ophelia JINNY DOUGLAS, MD   [facility] Providers Cardiologist:  None        Patient Profile: Peter Mccann is a 68 y.o. male with a hx of hypertension, hyperlipidemia, diabetes, obesity, paroxysmal atrial fibrillation, and CAD s/p PCI to mid LAD on 10/2018 who is being seen 04/30/2024 for the evaluation of atrial fibrillation at the request of Sid Boning MD.  History of Present Illness: Peter Mccann is a 68 year old male who is followed by cardiology at the [facility].   His atrial fibrillation was initially diagnosed on 02/2023.  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  A TTE was also performed that showed a low normal LVEF of 50%, mild LVH, mild MR, mild TR, no stenosis, and dilated ascending aorta measuring 4.0 cm.  The patient was last seen for outpatient follow-up on 09/2023.  His medications at that time included Coreg , amlodipine , and Eliquis.   Patient presented to the emergency department intoxicated.  He reportedly hit his head on the bathroom countertop and sustained an injury.  He was found to be in A-fib with RVR.  He was started on IV amiodarone , IV cefepime , lactated Ringer 's, Ativan , and Haldol .  On interview patient reported that he has been drinking a lot more vodka in the last month due to family related stress.  Stated that he drank half 1/5 today.  Reports that he also dip's.  Denies any symptoms with his atrial fibrillation.  Denies any chest pain, shortness of breath, lower extremity edema, orthopnea, fever, or diaphoresis.  Denies any snoring or daytime somnolence.  Labs showed elevated alcohol of 339, elevated lactic acid of 6.8 > 7.1, leukocytosis of 12.6, hemoglobin of 13.3, platelet count of 194, potassium of 3.7, creatinine of 1.2, and negative  respiratory panel.  EKG showed atrial fibrillation with a ventricular rate of 136.  CT head and C-spine showed no acute abnormalities.  CT chest abdomen and pelvis showed no acute abnormalities and bilateral lung opacities.  Past Medical History:  Diagnosis Date   Abdominal pain    Alcohol abuse    Anemia    Arthritis    B12 deficiency    BMI 40.0-44.9, adult (HCC)    Depression    Diabetes (HCC)    High cholesterol    Hypertension    Plaque psoriasis    Substance abuse (HCC)     Past Surgical History:  Procedure Laterality Date   ANKLE SURGERY Right    CARDIOVERSION N/A 03/30/2023   Procedure: CARDIOVERSION;  Surgeon: Dewane Shiner, DO;  Location: [facility] ORS;  Service: Cardiovascular;  Laterality: N/A;   COLONOSCOPY WITH PROPOFOL  N/A 07/23/2017   Procedure: COLONOSCOPY WITH PROPOFOL ;  Surgeon: Viktoria Lamar DASEN, MD;  Location[facility] ENDOSCOPY;  Service: Endoscopy;  Laterality: N/A;   CORONARY STENT INTERVENTION N/A 11/06/2018   Procedure: CORONARY STENT INTERVENTION;  Surgeon: Ammon Blunt, MD;  Location: [facility] INVASIVE CV LAB;  Service: Cardiovascular;  Laterality: N/A;   ESOPHAGOGASTRODUODENOSCOPY (EGD) WITH PROPOFOL  N/A 07/23/2017   Procedure: ESOPHAGOGASTRODUODENOSCOPY (EGD) WITH PROPOFOL ;  Surgeon: Viktoria Lamar DASEN, MD;  Location: [facility] ENDOSCOPY;  Service: Endoscopy;  Laterality: N/A;   LEFT HEART CATH AND CORONARY ANGIOGRAPHY Left 11/06/2018   Procedure: LEFT HEART CATH AND CORONARY ANGIOGRAPHY;  Surgeon: Ammon Blunt, MD;  Location: [facility] INVASIVE CV LAB;  Service: Cardiovascular;  Laterality: Left;     Home Medications:  Prior to Admission medications  Medication Sig Start Date End Date Taking? Authorizing Provider  apixaban (ELIQUIS) 5 MG TABS tablet Take 5 mg by mouth every 12 (twelve) hours. 02/21/23   [provider]  aspirin  81 MG chewable tablet Chew 1 tablet (81 mg total) by mouth daily. 11/07/18   Paraschos, Alexander, MD   Aspirin -Acetaminophen -Caffeine (GOODY HEADACHE PO) Take 1 Package by mouth daily as needed (Headache).    [provider]  atorvastatin  (LIPITOR ) 80 MG tablet Take 80 mg by mouth daily. 06/19/16   [provider]  carvedilol  (COREG ) 25 MG tablet Take 12.5 mg by mouth 2 (two) times daily. 05/30/16   [provider]  clobetasol (TEMOVATE) 0.05 % external solution Apply 1 Application topically once a week. 02/07/22   [provider]  clobetasol ointment (TEMOVATE) 0.05 % Apply 1 Application topically once a week. 02/07/22   [provider]  Clobetasol Propionate 0.05 % shampoo Apply 1 application  topically 2 (two) times a week. 02/07/22   [provider]  DULoxetine (CYMBALTA) 60 MG capsule Take 60 mg by mouth every evening. 02/06/23   [provider]  HYDROcodone-acetaminophen  (NORCO) 7.5-325 MG tablet Take 1 tablet by mouth every 6 (six) hours as needed for moderate pain (pain score 4-6). 03/01/23   [provider]  Magnesium  400 MG CAPS Take 400 mg by mouth daily.    [provider]  MOUNJARO 7.5 MG/0.5ML Pen Inject 7.5 mg into the skin once a week. 03/16/23   [provider]  Multiple Vitamins-Minerals (MULTIVITAMIN WITH MINERALS) tablet Take 1 tablet by mouth daily. men    [provider]  OVER THE COUNTER MEDICATION Take 1 tablet by mouth every evening. super beta prostate    [provider]  pantoprazole  (PROTONIX ) 40 MG tablet Take 40 mg by mouth 2 (two) times daily.    [provider]  potassium chloride  (K-DUR) 10 MEQ tablet Take 10 mEq by mouth daily. 04/15/18   [provider]  testosterone (ANDROGEL) 50 MG/5GM (1%) GEL Place 5 g onto the skin daily. 01/26/23   [provider]  traZODone  (DESYREL ) 50 MG tablet Take 150 mg by mouth at bedtime.    [provider]    Scheduled Meds:  Continuous Infusions:  amiodarone  60 mg/hr (04/30/24 1531)   Followed  by   amiodarone      doxycycline  (VIBRAMYCIN ) IV     PRN Meds:   Allergies:   Allergies[1]  Social History:   Social History   Socioeconomic History   Marital status: Married    Spouse name: Not on file   Number of children: Not on file   Years of education: Not on file   Highest education level: Not on file  Occupational History   Not on file  Tobacco Use   Smoking status: Never   Smokeless tobacco: Current    Types: Snuff  Substance and Sexual Activity   Alcohol use: Not Currently   Drug use: No    Comment: history of cocaine abouse quit 1997   Sexual activity: Not on file  Other Topics Concern   Not on file  Social History Narrative   Not on file   Social Drivers of Health   Tobacco Use: High Risk (04/30/2024)   Patient History    Smoking Tobacco Use: Never    Smokeless Tobacco Use: Current    Passive Exposure: Not on file  Financial Resource Strain: Not on file  Food Insecurity: Not on file  Transportation Needs: Not on file  Physical Activity: Not on file  Stress: Not on file  Social Connections: Unknown (09/27/2021)   Received from [facility]   Social Network    Social Network: Not on file  Intimate Partner Violence: Unknown (08/19/2021)   Received from [facility]   HITS    Physically Hurt: Not on file    Insult or Talk Down To: Not on file    Threaten Physical Harm: Not on file    Scream or Curse: Not on file  Depression (PHQ2-9): Low Risk (07/27/2021)   Depression (PHQ2-9)    PHQ-2 Score: 0  Alcohol Screen: Not on file  Housing: Unknown (06/04/2023)   Received from [facility]   Epic    Unable to Pay for Housing in the Last Year: Not on file    Number of Times Moved in the Last Year: Not on file    At any time in the past 12 months, were you homeless or living in a shelter (including now)?: No  Utilities: Not on file  Health Literacy: Not on file    Family History:   History reviewed. No pertinent family history.    ROS:  Please see the history of present illness.   All other ROS reviewed and negative.     Physical Exam/Data: Vitals:   04/30/24 1445 04/30/24 1500 04/30/24 1531 04/30/24 1549  BP: (!) 85/53 103/81  134/86  Pulse:      Resp:  16  19  Temp:   97.9 F (36.6 C)   TempSrc:   Oral   SpO2:      Weight:      Height:        Intake/Output Summary (Last 24 hours) at 04/30/2024 1556 Last data filed at 04/30/2024 1544 Gross per 24 hour  Intake --  Output 225 ml  Net -225 ml      04/30/2024   12:09 PM 04/30/2024   11:35 AM 03/30/2023   11:42 AM  Last 3 Weights  Weight (lbs) 181 lb 181 lb 212 lb  Weight (kg) 82.101 kg 82.101 kg 96.163 kg     Body mass index is 26.73 kg/m.  General:  Well nourished, well developed, in no acute distress.  Alert and orientated HEENT: Has a large laceration on his scalp. Neck: no JVD Vascular: No carotid bruits; Distal pulses 2+ bilaterally Cardiac:  normal S1, S2; irregularly irregular rhythm; no murmur. Lungs:  clear to auscultation bilaterally, no wheezing, rhonchi or rales  Abd: soft, nontender, no hepatomegaly  Ext: no edema Musculoskeletal:  No deformities. Skin: warm and dry  Neuro:   no focal abnormalities noted Psych:  Normal affect   EKG:  The EKG was personally reviewed and demonstrates:  atrial fibrillation with a ventricular rate of 136. Telemetry:  Telemetry was personally reviewed and demonstrates: Atrial fibrillation.  Ventricular rates were initially in the 130s and 140s.  Heart rates recently have improved to the 110s.  Relevant CV Studies: Prior TTE on 10/24 at [facility] CONCLUSION -------------------------------------------------------------------------------  MILD LEFT VENTRICULAR SYSTOLIC DYSFUNCTION WITH MILD LVH  ESTIMATED EF: 50%  DIASTOLIC FUNCTION CAN'T BE DETERMINED  NORMAL RIGHT VENTRICULAR SYSTOLIC FUNCTION  VALVULAR REGURGITATION: No AR, MILD MR, No PR, MILD TR  NO VALVULAR STENOSIS.  Repeat echo  pending  Laboratory Data: High Sensitivity Troponin:  No results for input(s): TROPONINIHS in the last 720 hours. No results  for input(s): TRNPT in the last 720 hours.    Chemistry Recent Labs  Lab 04/30/24 1027 04/30/24 1031  NA 141 141  K 3.9 3.7  CL 100 103  CO2 21*  --   GLUCOSE 79 80  BUN 22 23  CREATININE 0.91 1.20  CALCIUM  8.8*  --   GFRNONAA >60  --   ANIONGAP 20*  --     Recent Labs  Lab 04/30/24 1027  PROT 6.4*  ALBUMIN 3.9  AST 40  ALT 24  ALKPHOS 109  BILITOT 1.7*   Lipids No results for input(s): CHOL, TRIG, HDL, LABVLDL, LDLCALC, CHOLHDL in the last 168 hours.  Hematology Recent Labs  Lab 04/30/24 1027 04/30/24 1031  WBC 12.6*  --   RBC 4.26  --   HGB 13.3 13.9  HCT 39.6 41.0  MCV 93.0  --   MCH 31.2  --   MCHC 33.6  --   RDW 15.6*  --   PLT 194  --    Thyroid No results for input(s): TSH, FREET4 in the last 168 hours.  BNPNo results for input(s): BNP, PROBNP in the last 168 hours.  DDimer No results for input(s): DDIMER in the last 168 hours.  Radiology/Studies:  DG Chest 1 View Result Date: 04/30/2024 CLINICAL DATA:  Fall EXAM: CHEST  1 VIEW COMPARISON:  None Available. FINDINGS: The heart size and mediastinal contours are within normal limits. Both lungs are clear. The visualized skeletal structures are unremarkable. IMPRESSION: No active disease. Electronically Signed   By: Lynwood Landy Raddle M.D.   On: 04/30/2024 11:58   DG Femur Min 2 Views Right Result Date: 04/30/2024 CLINICAL DATA:  Fall EXAM: RIGHT FEMUR 2 VIEWS COMPARISON:  None Available. FINDINGS: There is no evidence of fracture or other focal bone lesions. Soft tissues are unremarkable. IMPRESSION: Negative. Electronically Signed   By: Lynwood Landy Raddle M.D.   On: 04/30/2024 11:57   DG Pelvis 1-2 Views Result Date: 04/30/2024 CLINICAL DATA:  Fall EXAM: PELVIS - 1-2 VIEW COMPARISON:  None Available. FINDINGS: There is no evidence of pelvic fracture or  diastasis. No pelvic bone lesions are seen. IMPRESSION: Negative. Electronically Signed   By: Lynwood Landy Raddle M.D.   On: 04/30/2024 11:57   CT L-SPINE NO CHARGE Result Date: 04/30/2024 EXAM: CT OF THE LUMBAR SPINE WITHOUT CONTRAST 04/30/2024 11:08:07 AM TECHNIQUE: CT of the lumbar spine was performed without the administration of intravenous contrast. Multiplanar reformatted images are provided for review. Automated exposure control, iterative reconstruction, and/or weight based adjustment of the mA/kV was utilized to reduce the radiation dose to as low as reasonably achievable. COMPARISON: None available. CLINICAL HISTORY: FINDINGS: BONES AND ALIGNMENT: The lumbar vertebrae maintain the height and alignment. No acute fracture or suspicious bone lesion. DEGENERATIVE CHANGES: There is moderate chronic facet arthrosis at L4-L5 and L5-S1. SOFT TISSUES: No acute abnormality. IMPRESSION: 1. Moderate chronic facet arthrosis at L4-5 and L5-S1. Electronically signed by: Evalene Coho MD 04/30/2024 11:37 AM EST RP Workstation: [id number]   CT T-SPINE NO CHARGE Result Date: 04/30/2024 EXAM: CT THORACIC SPINE WITH CONTRAST 04/30/2024 11:08:07 AM TECHNIQUE: CT of the thoracic spine was performed with the administration of 75 mL of iohexol  (OMNIPAQUE ) 350 MG/ML injection. Multiplanar reformatted images are provided for review. Automated exposure control, iterative reconstruction, and/or weight based adjustment of the mA/kV was utilized to reduce the radiation dose to as low as reasonably achievable. COMPARISON: None available. CLINICAL HISTORY: FINDINGS: BONES AND ALIGNMENT: Normal vertebral body heights.  No acute fracture or suspicious bone lesion. Normal alignment. DEGENERATIVE CHANGES: There is multilevel degenerative disc disease within the thoracic spine. SOFT TISSUES: No acute abnormality. IMPRESSION: 1. No acute abnormality of the thoracic spine. 2. Multilevel degenerative disc disease within the thoracic  spine. Electronically signed by: Evalene Coho MD 04/30/2024 11:36 AM EST RP Workstation: [id number]   CT CHEST ABDOMEN PELVIS W CONTRAST Result Date: 04/30/2024 EXAM: CT CHEST, ABDOMEN AND PELVIS WITH CONTRAST 04/30/2024 11:08:07 AM TECHNIQUE: CT of the chest, abdomen and pelvis was performed with the administration of 75 mL of iohexol  (OMNIPAQUE ) 350 MG/ML injection. Multiplanar reformatted images are provided for review. Automated exposure control, iterative reconstruction, and/or weight based adjustment of the mA/kV was utilized to reduce the radiation dose to as low as reasonably achievable. COMPARISON: None available. CLINICAL HISTORY: Polytrauma, blunt. FINDINGS: CHEST: MEDIASTINUM AND LYMPH NODES: Heart and pericardium are unremarkable. Moderate calcific coronary artery disease. The central airways are clear. The thoracic aorta demonstrates mild calcific atheromatous disease. No mediastinal, hilar or axillary lymphadenopathy. LUNGS AND PLEURA: There are ground-glass and reticular opacities present dependently within the lungs bilaterally, likely representing combination of mild edema and atelectasis. No pleural effusion or pneumothorax. ABDOMEN AND PELVIS: LIVER: The liver is unremarkable. GALLBLADDER AND BILE DUCTS: Gallbladder is unremarkable. No biliary ductal dilatation. SPLEEN: No acute abnormality. PANCREAS: No acute abnormality. ADRENAL GLANDS: No acute abnormality. KIDNEYS, URETERS AND BLADDER: No stones in the kidneys or ureters. No hydronephrosis. No perinephric or periureteral stranding. Urinary bladder is unremarkable. GI AND BOWEL: Stomach demonstrates no acute abnormality. There are numerous colonic diverticula, but no evidence of diverticulitis. The appendix is not clearly identified. There is no bowel obstruction. REPRODUCTIVE ORGANS: No acute abnormality. PERITONEUM AND RETROPERITONEUM: No ascites. No free air. VASCULATURE: The abdominal aorta is normal in caliber and demonstrates  mild calcific atheromatous disease. ABDOMINAL AND PELVIS LYMPH NODES: No lymphadenopathy. REPRODUCTIVE ORGANS: No acute abnormality. BONES AND SOFT TISSUES: The bones are intact. There are mild degenerative changes within the lumbar spine and hips. No focal soft tissue abnormality. IMPRESSION: 1. No acute findings in the chest, abdomen, and pelvis related to polytrauma. 2. Dependent ground-glass and reticular opacities in the lungs bilaterally, likely representing mild edema and atelectasis. 3. Moderate calcific coronary artery disease and mild calcific atheromatous disease in the thoracic and abdominal aorta. 4. Numerous colonic diverticula without evidence of diverticulitis. Electronically signed by: Evalene Coho MD 04/30/2024 11:34 AM EST RP Workstation: [id number]   CT CERVICAL SPINE WO CONTRAST Result Date: 04/30/2024 EXAM: CT CERVICAL SPINE WITHOUT CONTRAST 04/30/2024 11:08:07 AM TECHNIQUE: CT of the cervical spine was performed without the administration of intravenous contrast. Multiplanar reformatted images are provided for review. Automated exposure control, iterative reconstruction, and/or weight based adjustment of the mA/kV was utilized to reduce the radiation dose to as low as reasonably achievable. COMPARISON: None available. CLINICAL HISTORY: Polytrauma, blunt. FINDINGS: BONES AND ALIGNMENT: There is no evidence of fracture or acute traumatic injury. DEGENERATIVE CHANGES: There is chronic degenerative disc disease present at C3-C4, C5-C6 and C6-C7, with posterior disc bulging and endplate ridging causing mild-to-moderate central spinal canal stenosis at each level. There is also moderate diffuse bilateral facet arthrosis. The right facets at C2-C3 are fused. SOFT TISSUES: No prevertebral soft tissue swelling. IMPRESSION: 1. No evidence of fracture or acute traumatic injury. 2. Chronic degenerative disc disease at C3-4, C5-6, and C6-7 with posterior disc bulging/endplate ridging causing  mild-to-moderate central spinal canal stenosis at each level. 3. Moderate diffuse bilateral facet arthrosis. Right facets  at C2-3 are fused. Electronically signed by: Evalene Coho MD 04/30/2024 11:29 AM EST RP Workstation: [id number]   CT HEAD WO CONTRAST Result Date: 04/30/2024 EXAM: CT HEAD WITHOUT CONTRAST 04/30/2024 11:08:07 AM TECHNIQUE: CT of the head was performed without the administration of intravenous contrast. Automated exposure control, iterative reconstruction, and/or weight based adjustment of the mA/kV was utilized to reduce the radiation dose to as low as reasonably achievable. COMPARISON: 01/31/2024 CLINICAL HISTORY: Head trauma, moderate-severe. FINDINGS: BRAIN AND VENTRICLES: No acute hemorrhage. No evidence of acute infarct. No hydrocephalus. No extra-axial collection. No mass effect or midline shift. ORBITS: No acute abnormality. SINUSES: No acute abnormality. SOFT TISSUES AND SKULL: Left scalp soft tissue swelling and laceration. No skull fracture. IMPRESSION: 1. No acute intracranial abnormality. 2. Left scalp soft tissue swelling and laceration. Electronically signed by: Evalene Coho MD 04/30/2024 11:26 AM EST RP Workstation: [id number]     Assessment and Plan: Llewellyn Choplin is a 68 y.o. male with a hx of hypertension, hyperlipidemia, diabetes, obesity, paroxysmal atrial fibrillation, and CAD s/p PCI to mid LAD on 10/2018 who is being seen 04/30/2024 for the evaluation of atrial fibrillation at the request of Sid Boning MD.   Atrial fibrillation with RVR Came to the emergency department after he drank half of the fifth of vodka and fell and hit his head. CHA2DS2-VASc Score = 4 [CHF History: 0, HTN History: 1, Diabetes History: 1, Stroke History: 0, Vascular Disease History: 1, Age Score: 1, Gender Score: 0].  Therefore, the patient's annual risk of stroke is 4.8 %.    His atrial fibrillation with RVR is likely provoked by alcohol abuse.  He denies any snoring,  daytime somnolence or other symptoms of sleep apnea Heart rates on telemetry were initially in the 130s and 140s. Was started on IV amiodarone  and heart rates have improved into the 110s. Goal potassium greater than 4, magnesium  greater than 2. Hold anticoagulation for now with head laceration. Ordered TSH and magnesium . Continue IV amiodarone .  Plan to stop after blood pressure improves. Hold home Coreg  12.5 mg twice daily. May consider starting metoprolol  tartrate 12.5mg  every 6 hours for rate control.   Alcohol abuse Reported that he has been drinking a lot more vodka in the last month.  Stated that this was related to stress from a family related event. Recommended cessation.  Patient is agreeable. May consider thiamine  supplements Management per primary   CAD s/p PCI to LAD in 2020 Hyperlipidemia Continue atorvastatin  80 mg daily May stop aspirin  as patient is on Eliquis for atrial fibrillation.   Elevated lactic acid Suspect this is from alcohol abuse.   Otherwise management per primary  Risk Assessment/Risk Scores:        CHA2DS2-VASc Score = 4 :1} This indicates a 4.8% annual risk of stroke. The patient's score is based upon: CHF History: 0 HTN History: 1 Diabetes History: 1 Stroke History: 0 Vascular Disease History: 1 Age Score: 1 Gender Score: 0        For questions or updates, please contact [facility] Please consult [email or web address] for contact info under     Signed, Morse Clause, PA-C  04/30/2024 3:56 PM     [1]  Allergies Allergen Reactions   Metformin And Related Other (See Comments)    Upset stomach

## 2024-04-30 NOTE — H&P (Signed)
 Date: 04/30/2024               Patient Name:  Peter Mccann MRN: [id number]  DOB: 09/21/1955 Age / Sex: 68 y.o., male   PCP: Fernande Ophelia JINNY DOUGLAS, MD         Medical Service: Internal Medicine Teaching Service         Attending Physician: Dr. MICAEL Riis Winfrey      First Contact: Remonia Romano, DO}    Second Contact: Dr. Damien Lease, DO         Pager Information: First Contact Pager: [phone]   Second Contact Pager: [phone]   SUBJECTIVE   Chief Complaint: syncope   History of Present Illness: Peter Mccann is a 68 y.o. male with PMH of alcoholic ketosis, CAD, chronic pain syndrome, persistent atrial fibrillation, hyperlipidemia, alcohol abuse, B12 deficiency, anemia, type 2 diabetes mellitus that presented today after a syncopal episode.  Patient states over the last week he has been blacking out .  He states that the episodes prior to today were due to him not drinking and eating enough and he denies losing consciousness at those times.  Today he states that the episode was different he was walking up the stairs into his house after picking his son up from jail and states that he fell down and hit his head today.  He endorses loss of consciousness during this episode and states that it took about 2 minutes for him to come to.  He states that when he had fallen last week he also had his day about 4 to 5 days ago and had a large wound on his head due to this that was made worse today.  He states that his last drink was this morning at 6 AM.  He said that he had quit drinking about 4 to 5 years ago but recently started back up and is drinking half fifth a day.  He started again about 3 years ago.  He states that he has never had any history of alcohol seizure or delirium tremens that he is aware of.  No hospitalizations for alcohol withdrawal that he endorsed.  He denies any fevers, chills, cough.  He states that he had some dysuria but this is only when he got the cath in the ED room  for urine sample.  He states his last abdominal pain was about a day ago.  He states that he has regular bowel movements and he takes hydrocodone for pain every day but still able to have a bowel movement.  He states that his last meal was yesterday and he is on Mounjaro so he does not eat a lot.  He states that he eats a lot of potato salad and has some oatmeal cake but very small portion sizes.  He states that he has been having vomiting for the last 5 to 6 days once or twice a day.  He denies any hematemesis.  He denies any chest pain, palpitations or shortness of breath prior to presenting to the hospital and at the time of evaluation.   ED Course: Labs significant for  Urine drug screen positive for opioids UA shows specific gravity but he greater than 1.030, large Hgb urine dipstick, small bilirubin, 40 ketones, protein 30, no nitrite or leukocytes and no bacteria seen CMP significant for total bilirubin of 1.7 CBC showed leukocytosis of 12.6 Ethanol 339 PT 17.7 and INR 1.4 Lactic acid 6.8-> 7.1-> 6->5.9 VBG shows pH of 7.42  and bicarb of 19, pCO2 29 and pO2 48 proBNP 265 CK 155 TSH 0.917 Anion gap 20 Respiratory panel negative Imaging full trauma scan is negative CT shows no acute intracranial abnormality, left scalp soft tissue swelling and laceration CT cervical spine shows chronic degenerative disc disease at multiple levels and moderate diffuse bilateral facet arthrosis CT chest shows dependent ground glass and reticular opacities in the lungs bilaterally are likely representing mild edema and atelectasis, moderate calcific coronary artery disease mild calcific or thrombus disease mid thoracic and abdominal aorta, colonic diverticula Chest x-ray shows no active disease Received lorazepam  IV 1 mg, 1 L LR bolus x 2, cefepime  2 g IV, Haldol  2 mg IV, amiodarone  IV 150 mg, doxycycline  100 IV, thiamine  100 mg IV, metoprolol  tartrate 50 mg Consulted cardiology  Past Medical  History alcoholic ketosis  CAD s/p PCI x2?  chronic pain syndrome  persistent atrial fibrillation  hyperlipidemia,  alcohol abuse B12 deficiency  Anemia  type 2 diabetes mellitus  Past Surgical History Past Surgical History:  Procedure Laterality Date   ANKLE SURGERY Right    CARDIOVERSION N/A 03/30/2023   Procedure: CARDIOVERSION;  Surgeon: Dewane Shiner, DO;  Location: [facility] ORS;  Service: Cardiovascular;  Laterality: N/A;   COLONOSCOPY WITH PROPOFOL  N/A 07/23/2017   Procedure: COLONOSCOPY WITH PROPOFOL ;  Surgeon: Viktoria Lamar DASEN, MD;  Location: [facility] ENDOSCOPY;  Service: Endoscopy;  Laterality: N/A;   CORONARY STENT INTERVENTION N/A 11/06/2018   Procedure: CORONARY STENT INTERVENTION;  Surgeon: Ammon Blunt, MD;  Location: [facility] INVASIVE CV LAB;  Service: Cardiovascular;  Laterality: N/A;   ESOPHAGOGASTRODUODENOSCOPY (EGD) WITH PROPOFOL  N/A 07/23/2017   Procedure: ESOPHAGOGASTRODUODENOSCOPY (EGD) WITH PROPOFOL ;  Surgeon: Viktoria Lamar DASEN, MD;  Location: [facility] ENDOSCOPY;  Service: Endoscopy;  Laterality: N/A;   LEFT HEART CATH AND CORONARY ANGIOGRAPHY Left 11/06/2018   Procedure: LEFT HEART CATH AND CORONARY ANGIOGRAPHY;  Surgeon: Ammon Blunt, MD;  Location: [facility] INVASIVE CV LAB;  Service: Cardiovascular;  Laterality: Left;    Meds:  Active Medications[1] Protonix  daily Eliquis 5mg  BID, daily, last dose this am  Mounjaro 15 mg weekly, last dose yesterday K daily 2 pills a day ( states 5,000 mg) Lipitor  daily  Coreg ( states he does not take) Hydrocodone  (4 pills daily) Multivitamin  Testosterone gel: no  Shampoo prn Trazodone  150 mg  Cymbalta    Social:  Lives With: Wife, multiple family members live on the same property as him Occupation: Works with Support: Used to be his wife but states recently they have been a lot of family arguments and the support has been diminished Level of Function: Dependent in ADLs and IADLs PCP:  Fernande Ophelia JINNY DOUGLAS, MD   Substances: -Tobacco: none  -Alcohol: 1/2 a 5th a day, last drink was 6 am -Recreational Drug: cocaine 20 years ago   Family History:  History reviewed. No pertinent family history.   Allergies: Allergies as of 04/30/2024 - Review Complete 04/30/2024  Allergen Reaction Noted   Metformin and related Other (See Comments) 08/26/2018    Review of Systems: A complete ROS was negative except as per HPI.   OBJECTIVE:   Physical Exam: Blood pressure 134/86, pulse (!) 128, temperature 97.9 F (36.6 C), temperature source Oral, resp. rate 19, height 5' 9 (1.753 m), weight 82.1 kg, SpO2 100%.  Constitutional: well-appearing male in no acute distress sitting in bed HENT: Head laceration down midline of head with oozing fresh blood, mucous membranes dry Eyes: conjunctiva non-erythematous Neck: supple Cardiovascular: irregularly irregular,no murmurs appreciated  Pulmonary/Chest: normal work of breathing on room air, lungs clear to auscultation bilaterally Abdominal: soft, non-tender, non-distended, no scars, negative murphy's sign MSK: normal bulk and tone, no lower extremity edema or calf tenderness bilaterally Neurological: alert & oriented x 3  Psych: calm   Labs: CBC    Component Value Date/Time   WBC 12.6 (H) 04/30/2024 1027   RBC 4.26 04/30/2024 1027   HGB 13.9 04/30/2024 1031   HGB 13.3 12/29/2012 2310   HCT 41.0 04/30/2024 1031   HCT 37.5 (L) 12/29/2012 2310   PLT 194 04/30/2024 1027   PLT 165 12/29/2012 2310   MCV 93.0 04/30/2024 1027   MCV 94 12/29/2012 2310   MCH 31.2 04/30/2024 1027   MCHC 33.6 04/30/2024 1027   RDW 15.6 (H) 04/30/2024 1027   RDW 12.5 12/29/2012 2310   LYMPHSABS 3.1 05/20/2018 0800   MONOABS 0.8 05/20/2018 0800   EOSABS 0.0 05/20/2018 0800   BASOSABS 0.1 05/20/2018 0800     CMP     Component Value Date/Time   NA 138 04/30/2024 1707   NA 141 12/29/2012 2310   K 4.4 04/30/2024 1707   K 3.5 12/29/2012 2310   CL 101 04/30/2024 1707   CL  106 12/29/2012 2310   CO2 17 (L) 04/30/2024 1707   CO2 27 12/29/2012 2310   GLUCOSE 72 04/30/2024 1707   GLUCOSE 166 (H) 12/29/2012 2310   BUN 19 04/30/2024 1707   BUN 18 12/29/2012 2310   CREATININE 0.66 04/30/2024 1707   CREATININE 0.71 12/29/2012 2310   CALCIUM  8.3 (L) 04/30/2024 1707   CALCIUM  7.8 (L) 12/29/2012 2310   PROT 6.4 (L) 04/30/2024 1027   PROT 6.3 (L) 12/29/2012 2310   ALBUMIN 3.9 04/30/2024 1027   ALBUMIN 3.3 (L) 12/29/2012 2310   AST 40 04/30/2024 1027   AST 61 (H) 12/29/2012 2310   ALT 24 04/30/2024 1027   ALT 50 12/29/2012 2310   ALKPHOS 109 04/30/2024 1027   ALKPHOS 77 12/29/2012 2310   BILITOT 1.7 (H) 04/30/2024 1027   BILITOT 0.6 12/29/2012 2310   GFRNONAA >60 04/30/2024 1707   GFRNONAA >60 12/29/2012 2310   GFRAA >60 05/21/2018 0254   GFRAA >60 12/29/2012 2310    Imaging:  DG Chest 1 View Result Date: 04/30/2024 CLINICAL DATA:  Fall EXAM: CHEST  1 VIEW COMPARISON:  None Available. FINDINGS: The heart size and mediastinal contours are within normal limits. Both lungs are clear. The visualized skeletal structures are unremarkable. IMPRESSION: No active disease. Electronically Signed   By: Lynwood Landy Raddle M.D.   On: 04/30/2024 11:58   DG Femur Min 2 Views Right Result Date: 04/30/2024 CLINICAL DATA:  Fall EXAM: RIGHT FEMUR 2 VIEWS COMPARISON:  None Available. FINDINGS: There is no evidence of fracture or other focal bone lesions. Soft tissues are unremarkable. IMPRESSION: Negative. Electronically Signed   By: Lynwood Landy Raddle M.D.   On: 04/30/2024 11:57   DG Pelvis 1-2 Views Result Date: 04/30/2024 CLINICAL DATA:  Fall EXAM: PELVIS - 1-2 VIEW COMPARISON:  None Available. FINDINGS: There is no evidence of pelvic fracture or diastasis. No pelvic bone lesions are seen. IMPRESSION: Negative. Electronically Signed   By: Lynwood Landy Raddle M.D.   On: 04/30/2024 11:57   CT L-SPINE NO CHARGE Result Date: 04/30/2024 EXAM: CT OF THE LUMBAR SPINE WITHOUT CONTRAST  04/30/2024 11:08:07 AM TECHNIQUE: CT of the lumbar spine was performed without the administration of intravenous contrast. Multiplanar reformatted images are provided for review. Automated exposure  control, iterative reconstruction, and/or weight based adjustment of the mA/kV was utilized to reduce the radiation dose to as low as reasonably achievable. COMPARISON: None available. CLINICAL HISTORY: FINDINGS: BONES AND ALIGNMENT: The lumbar vertebrae maintain the height and alignment. No acute fracture or suspicious bone lesion. DEGENERATIVE CHANGES: There is moderate chronic facet arthrosis at L4-L5 and L5-S1. SOFT TISSUES: No acute abnormality. IMPRESSION: 1. Moderate chronic facet arthrosis at L4-5 and L5-S1. Electronically signed by: Evalene Coho MD 04/30/2024 11:37 AM EST RP Workstation: [id number]   CT T-SPINE NO CHARGE Result Date: 04/30/2024 EXAM: CT THORACIC SPINE WITH CONTRAST 04/30/2024 11:08:07 AM TECHNIQUE: CT of the thoracic spine was performed with the administration of 75 mL of iohexol  (OMNIPAQUE ) 350 MG/ML injection. Multiplanar reformatted images are provided for review. Automated exposure control, iterative reconstruction, and/or weight based adjustment of the mA/kV was utilized to reduce the radiation dose to as low as reasonably achievable. COMPARISON: None available. CLINICAL HISTORY: FINDINGS: BONES AND ALIGNMENT: Normal vertebral body heights. No acute fracture or suspicious bone lesion. Normal alignment. DEGENERATIVE CHANGES: There is multilevel degenerative disc disease within the thoracic spine. SOFT TISSUES: No acute abnormality. IMPRESSION: 1. No acute abnormality of the thoracic spine. 2. Multilevel degenerative disc disease within the thoracic spine. Electronically signed by: Evalene Coho MD 04/30/2024 11:36 AM EST RP Workstation: [id number]   CT CHEST ABDOMEN PELVIS W CONTRAST Result Date: 04/30/2024 EXAM: CT CHEST, ABDOMEN AND PELVIS WITH CONTRAST 04/30/2024 11:08:07 AM  TECHNIQUE: CT of the chest, abdomen and pelvis was performed with the administration of 75 mL of iohexol  (OMNIPAQUE ) 350 MG/ML injection. Multiplanar reformatted images are provided for review. Automated exposure control, iterative reconstruction, and/or weight based adjustment of the mA/kV was utilized to reduce the radiation dose to as low as reasonably achievable. COMPARISON: None available. CLINICAL HISTORY: Polytrauma, blunt. FINDINGS: CHEST: MEDIASTINUM AND LYMPH NODES: Heart and pericardium are unremarkable. Moderate calcific coronary artery disease. The central airways are clear. The thoracic aorta demonstrates mild calcific atheromatous disease. No mediastinal, hilar or axillary lymphadenopathy. LUNGS AND PLEURA: There are ground-glass and reticular opacities present dependently within the lungs bilaterally, likely representing combination of mild edema and atelectasis. No pleural effusion or pneumothorax. ABDOMEN AND PELVIS: LIVER: The liver is unremarkable. GALLBLADDER AND BILE DUCTS: Gallbladder is unremarkable. No biliary ductal dilatation. SPLEEN: No acute abnormality. PANCREAS: No acute abnormality. ADRENAL GLANDS: No acute abnormality. KIDNEYS, URETERS AND BLADDER: No stones in the kidneys or ureters. No hydronephrosis. No perinephric or periureteral stranding. Urinary bladder is unremarkable. GI AND BOWEL: Stomach demonstrates no acute abnormality. There are numerous colonic diverticula, but no evidence of diverticulitis. The appendix is not clearly identified. There is no bowel obstruction. REPRODUCTIVE ORGANS: No acute abnormality. PERITONEUM AND RETROPERITONEUM: No ascites. No free air. VASCULATURE: The abdominal aorta is normal in caliber and demonstrates mild calcific atheromatous disease. ABDOMINAL AND PELVIS LYMPH NODES: No lymphadenopathy. REPRODUCTIVE ORGANS: No acute abnormality. BONES AND SOFT TISSUES: The bones are intact. There are mild degenerative changes within the lumbar spine and  hips. No focal soft tissue abnormality. IMPRESSION: 1. No acute findings in the chest, abdomen, and pelvis related to polytrauma. 2. Dependent ground-glass and reticular opacities in the lungs bilaterally, likely representing mild edema and atelectasis. 3. Moderate calcific coronary artery disease and mild calcific atheromatous disease in the thoracic and abdominal aorta. 4. Numerous colonic diverticula without evidence of diverticulitis. Electronically signed by: Evalene Coho MD 04/30/2024 11:34 AM EST RP Workstation: [id number]   CT CERVICAL SPINE WO CONTRAST Result Date: 04/30/2024 EXAM:  CT CERVICAL SPINE WITHOUT CONTRAST 04/30/2024 11:08:07 AM TECHNIQUE: CT of the cervical spine was performed without the administration of intravenous contrast. Multiplanar reformatted images are provided for review. Automated exposure control, iterative reconstruction, and/or weight based adjustment of the mA/kV was utilized to reduce the radiation dose to as low as reasonably achievable. COMPARISON: None available. CLINICAL HISTORY: Polytrauma, blunt. FINDINGS: BONES AND ALIGNMENT: There is no evidence of fracture or acute traumatic injury. DEGENERATIVE CHANGES: There is chronic degenerative disc disease present at C3-C4, C5-C6 and C6-C7, with posterior disc bulging and endplate ridging causing mild-to-moderate central spinal canal stenosis at each level. There is also moderate diffuse bilateral facet arthrosis. The right facets at C2-C3 are fused. SOFT TISSUES: No prevertebral soft tissue swelling. IMPRESSION: 1. No evidence of fracture or acute traumatic injury. 2. Chronic degenerative disc disease at C3-4, C5-6, and C6-7 with posterior disc bulging/endplate ridging causing mild-to-moderate central spinal canal stenosis at each level. 3. Moderate diffuse bilateral facet arthrosis. Right facets at C2-3 are fused. Electronically signed by: Evalene Coho MD 04/30/2024 11:29 AM EST RP Workstation: [id number]   CT HEAD  WO CONTRAST Result Date: 04/30/2024 EXAM: CT HEAD WITHOUT CONTRAST 04/30/2024 11:08:07 AM TECHNIQUE: CT of the head was performed without the administration of intravenous contrast. Automated exposure control, iterative reconstruction, and/or weight based adjustment of the mA/kV was utilized to reduce the radiation dose to as low as reasonably achievable. COMPARISON: 01/31/2024 CLINICAL HISTORY: Head trauma, moderate-severe. FINDINGS: BRAIN AND VENTRICLES: No acute hemorrhage. No evidence of acute infarct. No hydrocephalus. No extra-axial collection. No mass effect or midline shift. ORBITS: No acute abnormality. SINUSES: No acute abnormality. SOFT TISSUES AND SKULL: Left scalp soft tissue swelling and laceration. No skull fracture. IMPRESSION: 1. No acute intracranial abnormality. 2. Left scalp soft tissue swelling and laceration. Electronically signed by: Evalene Coho MD 04/30/2024 11:26 AM EST RP Workstation: [id number]     EKG: personally reviewed my interpretation is afib w/rvr. Prior EKG sinus rhythm   ASSESSMENT & PLAN:   Assessment & Plan by Problem: Principal Problem:   Lactic acidosis Active Problems:   Atrial fibrillation with rapid ventricular response (HCC)   Tyriek Hofman is a 68 y.o. male with PMH of alcoholic ketosis, CAD, chronic pain syndrome, persistent atrial fibrillation, hyperlipidemia, alcohol abuse, B12 deficiency, anemia, type 2 diabetes mellitus that presented today after a syncopal episode and is being admitted for lactic acidosis  #Lactic acidosis #elevated anion gap Likely secondary to alcohol ketoacidosis.  Patient's lactic acidosis was elevated at the time of presentation to the ED and continue to stay elevated to 5.9 spite being status post 2 L of fluids bolus.  Anion gap of 20.  Patient does have a history of type 2 diabetes so do have to consider DKA,HHS but blood sugars are not elevated.  Not describing any infectious symptoms so lower differential for  sepsis.  Patient did have cold lower extremities but good DP pulses bilaterally, lower differential for shock.  CK within normal limits.  Starvation ketosis especially with patient taking Mounjaro will also be on differential.  With his ethanol level do believe that this is more likely alcohol ketoacidosis.  Ketones noted in his urine. -Will treat with D5 and normal saline -Will continue to monitor AG with BMP   #A-fib with RVR During evaluation patient's rates were in the 130s.  He was asymptomatic and denied any chest pain, shortness of breath, palpitations.  He states that he does have a history of this before.  He  was started on amiodarone  in the ED while still having elevated rates but only bolus was given.  CHA2DS2-VASc score of 4.  Cardiology was consulted.  Appreciate their assistance. - Recommended holding home Eliquis due to head laceration. - Cardiology recommended stopping IV amiodarone  and starting metoprolol  tartrate 50 mg every 6 hours. - Echocardiogram ordered - TSH ordered - Magnesium  greater than 2 and potassium greater than 4  #CAD status post PCI No chest pain.  Holding Eliquis due to bleeding   # HTN Patient reports not taking his Coreg .  Per cardiology will transition to metoprolol  to improve rates and hold home Coreg   #Alcohol use disorder Patient's last drink was at 6 AM this morning.  He recently started drinking in the last 3 months he reports to us .  He denies any history of hospitalizations for alcohol withdrawal, history of DT, alcohol withdrawal seizures - IV thiamine  100 mg daily x 3 days, and afterwards will start oral thiamine  supplementation -Will give oral folate supplementation. -CIWA protocol with Ativan . - Counseled on alcohol cessation  #Chronic pain Is being prescribed Norco as an outpatient and reports taking it 4 times daily. -Will resume Norco 4 times daily as needed as patient reported at home  #Type 2 diabetes mellitus Lower differential for  DKA or HHS given blood sugars.  Patient is not on Jardiance so do not feel this is euglycemic DKA.  Ketones are noted in urine this is likely due to alcohol ketoacidosis.  Last A1c noted to be 5.7 on 01/24/2024 Will be giving patient D5 and will not add any insulin  at this time.  Continue to monitor blood sugars  #Scalp laceration Per ED provider will have to keep open to avoid any risk of infection as this laceration has been there for the last week.  Best practice: Diet: Normal VTE: SCDs usually on eliquis but being held with scalp lac  IVF: NS, Code: DNR/DNI  Disposition planning: Prior to Admission Living Arrangement: Home, living with wife Anticipated Discharge Location: pending Barriers to Discharge: clinical improvement   Dispo: Admit patient to Inpatient with expected length of stay greater than 2 midnights.  Signed: D'Mello, Hajra Port, DO Internal Medicine Resident  04/30/2024, 6:04 PM  Please contact IM Residency On-Call Pager at: [phone] or [phone].     [1]  Current Meds  Medication Sig   thiamine  (VITAMIN B1) 100 MG tablet Take 1 tablet (100 mg total) by mouth daily.

## 2024-04-30 NOTE — Progress Notes (Signed)
 CSW received call from patient's son, Fonda, expressing concern for patient's wellbeing and fear patient may leave AMA. No protected patient information was provided during this call as son is listed as an inactive contact in patient's chart. Spoke with patient at bedside, who confirmed he did not want staff to speak to his son. Patient ultimately signed out AMA.

## 2024-04-30 NOTE — Progress Notes (Signed)
 Orthopedic Tech Progress Note Patient Details:  Peter Mccann [birth year] [id number]  Level 2 trauma   Patient ID: Peter Mccann, male   DOB: 09/05/1955, 68 y.o.   MRN: [id number]  Peter Mccann 04/30/2024, 10:40 AM

## 2024-04-30 NOTE — Discharge Summary (Signed)
 Name: Peter Mccann MRN: [id number] DOB: December 05, 1955 68 y.o. PCP: Fernande Ophelia JINNY DOUGLAS, MD  Date of Admission: 04/30/2024 10:14 AM Date of Discharge: 04/30/2024 7:09 PM Attending Physician: Dr. Francesco   Discharge Diagnosis: Principal Problem:   Lactic acidosis Active Problems:   Atrial fibrillation with rapid ventricular response (HCC)    LEFT AGAINST MEDICAL ADVICE   Discharge Medications: Allergies as of 04/30/2024       Reactions   Metformin And Related Other (See Comments)   Upset stomach        Medication List     PAUSE taking these medications    apixaban 5 MG Tabs tablet Wait to take this until your doctor or other care provider tells you to start again. Please do not take the eliquis due to the head trauma and active bleeding form the scalp. You may resume this within 7 days or sooner if instructed by your PCP. If you continue to fall and hit your head, do NOT take eliquis until you have been evaluated by a doctor.  Commonly known as: ELIQUIS Take 5 mg by mouth every 12 (twelve) hours.   Mounjaro 7.5 MG/0.5ML Pen Wait to take this until your doctor or other care provider tells you to start again. Generic drug: tirzepatide Inject 7.5 mg into the skin once a week.       STOP taking these medications    GOODY HEADACHE PO   testosterone 50 MG/5GM (1%) Gel Commonly known as: ANDROGEL       TAKE these medications    aspirin  81 MG chewable tablet Chew 1 tablet (81 mg total) by mouth daily.   atorvastatin  80 MG tablet Commonly known as: LIPITOR  Take 80 mg by mouth daily.   carvedilol  25 MG tablet Commonly known as: COREG  Take 12.5 mg by mouth 2 (two) times daily.   Clobetasol Propionate 0.05 % shampoo Apply 1 application  topically 2 (two) times a week.   clobetasol ointment 0.05 % Commonly known as: TEMOVATE Apply 1 Application topically once a week.   clobetasol 0.05 % external solution Commonly known as: TEMOVATE Apply 1 Application  topically once a week.   DULoxetine 60 MG capsule Commonly known as: CYMBALTA Take 60 mg by mouth every evening.   folic acid  1 MG tablet Commonly known as: FOLVITE  Take 1 tablet (1 mg total) by mouth daily.   HYDROcodone-acetaminophen  7.5-325 MG tablet Commonly known as: NORCO Take 1 tablet by mouth every 6 (six) hours as needed for moderate pain (pain score 4-6).   Magnesium  400 MG Caps Take 400 mg by mouth daily.   multivitamin with minerals tablet Take 1 tablet by mouth daily. men   OVER THE COUNTER MEDICATION Take 1 tablet by mouth every evening. super beta prostate   pantoprazole  40 MG tablet Commonly known as: PROTONIX  Take 40 mg by mouth 2 (two) times daily.   potassium chloride  10 MEQ tablet Commonly known as: KLOR-CON  Take 10 mEq by mouth daily.   thiamine  100 MG tablet Commonly known as: VITAMIN B1 Take 1 tablet (100 mg total) by mouth daily.   traZODone  50 MG tablet Commonly known as: DESYREL  Take 150 mg by mouth at bedtime.        PATIENT LEFT AMA  Patient was alert and oriented x 3.  Did discuss risk of leaving AGAINST MEDICAL ADVICE.  Patient was able to understand this risk and repeat back to us  with the consequences were of leaving AGAINST MEDICAL ADVICE.  Did  understand that the biggest risk was death.  Did give him return precautions at if his nausea is, vomiting, breathing, chest pain or overall state got any worse that he should return to the emergency department immediately.  Disposition and follow-up:   Mr.Peter Mccann was discharged from [facility] in Serious condition.  At the hospital follow up visit please address:  1.  Follow-up:  *Lactic acidosis Elevated anion gap - We believe that this lactic acid is likely due to alcohol ketoacidosis with his history of drinking, ketones in his urine and anion gap.  He needs to have repeat BMP to check his anion gap and check lactic acid   *A-fib with RVR - Patient was  transition to metoprolol  tartrate 50 mg every 6 hours.  Needs repeat EKG.  His Eliquis was to be held due to bleeding from scalp laceration.  Cardiology had also recommended an echocardiogram that can be done as an outpatient unlikely.  Hemoglobin can be assessed at the time of PCP visit and wound check can be done and decision made to resume anticoagulation at this time with CHA2DS2-VASc of 3.   *Type 2 diabetes mellitus - In the setting of his lactic acidosis likely due to alcohol ketoacidosis, and tingling that has Mounjaro was also causing decreased p.o. intake which did not help this picture.  Did recommend that patient stop taking this medication until follow-up with primary care doctor.  Can assess to restart at that time    2.  Labs / imaging needed at time of follow-up: Lactic acid, BMP, EKG, CBC  3.  Pending labs/ test needing follow-up: Blood cultures, beta-hydroxybutyrate acid, lactic acid  4.  Medication Changes  Paused Eliquis and Mounjaro   Follow-up Appointments: Did advise patient to follow up with PCP   Hospital Course by problem list: Jahvon Gosline is a 69 y.o. male with PMH of alcoholic ketosis, CAD, chronic pain syndrome, persistent atrial fibrillation, hyperlipidemia, alcohol abuse, B12 deficiency, anemia, type 2 diabetes mellitus that presented today after a syncopal episode and is being admitted for lactic acidosis   #Lactic acidosis #elevated anion gap Likely secondary to alcohol ketoacidosis.  Patient's lactic acidosis was elevated at the time of presentation to the ED and continue to stay elevated to 5.9 spite being status post 2 L of fluids bolus.  Anion gap of 20.  Patient does have a history of type 2 diabetes so do have to consider DKA,HHS but blood sugars are not elevated.  Not describing any infectious symptoms so lower differential for sepsis.  Patient did have cold lower extremities but good DP pulses bilaterally, lower differential for shock.  CK within  normal limits.  Starvation ketosis especially with patient taking Mounjaro will also be on differential.  With his ethanol level do believe that this is more likely alcohol ketoacidosis.  Ketones noted in his urine. -Will treat with D5 and normal saline -Will continue to monitor AG with BMP    #A-fib with RVR During evaluation patient's rates were in the 130s.  He was asymptomatic and denied any chest pain, shortness of breath, palpitations.  He states that he does have a history of this before.  He was started on amiodarone  in the ED while still having elevated rates but only bolus was given.  CHA2DS2-VASc score of 4.  Cardiology was consulted.  Appreciate their assistance. - Recommended holding home Eliquis due to head laceration. - Cardiology recommended stopping IV amiodarone  and starting metoprolol  tartrate 50  mg every 6 hours. - Echocardiogram ordered - TSH ordered - Magnesium  greater than 2 and potassium greater than 4   #CAD status post PCI No chest pain.  Holding Eliquis due to bleeding     # HTN Patient reports not taking his Coreg .  Per cardiology will transition to metoprolol  to improve rates and hold home Coreg    #Alcohol use disorder Patient's last drink was at 6 AM this morning.  He recently started drinking in the last 3 months he reports to us .  He denies any history of hospitalizations for alcohol withdrawal, history of DT, alcohol withdrawal seizures - IV thiamine  100 mg daily x 3 days, and afterwards will start oral thiamine  supplementation -Will give oral folate supplementation. -CIWA protocol with Ativan . - Counseled on alcohol cessation   #Chronic pain Is being prescribed Norco as an outpatient and reports taking it 4 times daily. -Will resume Norco 4 times daily as needed as patient reported at home   #Type 2 diabetes mellitus Lower differential for DKA or HHS given blood sugars.  Patient is not on Jardiance so do not feel this is euglycemic DKA.  Ketones are  noted in urine this is likely due to alcohol ketoacidosis.  Last A1c noted to be 5.7 on 01/24/2024 Will be giving patient D5 and will not add any insulin  at this time.  Continue to monitor blood sugars   #Scalp laceration Per ED provider will have to keep open to avoid any risk of infection as this laceration has been there for the last week.   Discharge Subjective: Patient understands risk of leaving AGAINST MEDICAL ADVICE and states that he had things to do at home and people to take care of.  We did advise him that he has multiple medical issues right now that could cause death and patient understood this risk.  He understood return precautions if his symptoms worsen.  Discharge Exam:   Blood pressure 134/86, pulse (!) 128, temperature 97.9 F (36.6 C), temperature source Oral, resp. rate 19, height 5' 9 (1.753 m), weight 82.1 kg, SpO2 100%.  Constitutional: well-appearing male in no acute distress sitting in bed HENT: Head laceration down midline of head with oozing fresh blood, mucous membranes dry Eyes: conjunctiva non-erythematous Neck: supple Cardiovascular: irregularly irregular,no murmurs appreciated Pulmonary/Chest: normal work of breathing on room air, lungs clear to auscultation bilaterally Abdominal: soft, non-tender, non-distended, no scars, negative murphy's sign, no fluid wave  MSK: normal bulk and tone, no lower extremity edema or calf tenderness bilaterally Neurological: alert & oriented x 3  Psych: calm   Pertinent Labs, Studies, and Procedures:     Latest Ref Rng & Units 04/30/2024   10:31 AM 04/30/2024   10:27 AM 05/21/2018    2:54 AM  CBC  WBC 4.0 - 10.5 K/uL  12.6  6.2   Hemoglobin 13.0 - 17.0 g/dL 86.0  86.6  88.5   Hematocrit 39.0 - 52.0 % 41.0  39.6  33.6   Platelets 150 - 400 K/uL  194  128        Latest Ref Rng & Units 04/30/2024    5:07 PM 04/30/2024   10:31 AM 04/30/2024   10:27 AM  CMP  Glucose 70 - 99 mg/dL 72  80  79   BUN 8 - 23 mg/dL 19  23   22    Creatinine 0.61 - 1.24 mg/dL 9.33  8.79  9.08   Sodium 135 - 145 mmol/L 138  141  141   Potassium  3.5 - 5.1 mmol/L 4.4  3.7  3.9   Chloride 98 - 111 mmol/L 101  103  100   CO2 22 - 32 mmol/L 17   21   Calcium  8.9 - 10.3 mg/dL 8.3   8.8   Total Protein 6.5 - 8.1 g/dL   6.4   Total Bilirubin 0.0 - 1.2 mg/dL   1.7   Alkaline Phos 38 - 126 U/L   109   AST 15 - 41 U/L   40   ALT 0 - 44 U/L   24     DG Chest 1 View Result Date: 04/30/2024 CLINICAL DATA:  Fall EXAM: CHEST  1 VIEW COMPARISON:  None Available. FINDINGS: The heart size and mediastinal contours are within normal limits. Both lungs are clear. The visualized skeletal structures are unremarkable. IMPRESSION: No active disease. Electronically Signed   By: Lynwood Landy Raddle M.D.   On: 04/30/2024 11:58   DG Femur Min 2 Views Right Result Date: 04/30/2024 CLINICAL DATA:  Fall EXAM: RIGHT FEMUR 2 VIEWS COMPARISON:  None Available. FINDINGS: There is no evidence of fracture or other focal bone lesions. Soft tissues are unremarkable. IMPRESSION: Negative. Electronically Signed   By: Lynwood Landy Raddle M.D.   On: 04/30/2024 11:57   DG Pelvis 1-2 Views Result Date: 04/30/2024 CLINICAL DATA:  Fall EXAM: PELVIS - 1-2 VIEW COMPARISON:  None Available. FINDINGS: There is no evidence of pelvic fracture or diastasis. No pelvic bone lesions are seen. IMPRESSION: Negative. Electronically Signed   By: Lynwood Landy Raddle M.D.   On: 04/30/2024 11:57   CT L-SPINE NO CHARGE Result Date: 04/30/2024 EXAM: CT OF THE LUMBAR SPINE WITHOUT CONTRAST 04/30/2024 11:08:07 AM TECHNIQUE: CT of the lumbar spine was performed without the administration of intravenous contrast. Multiplanar reformatted images are provided for review. Automated exposure control, iterative reconstruction, and/or weight based adjustment of the mA/kV was utilized to reduce the radiation dose to as low as reasonably achievable. COMPARISON: None available. CLINICAL HISTORY: FINDINGS: BONES AND  ALIGNMENT: The lumbar vertebrae maintain the height and alignment. No acute fracture or suspicious bone lesion. DEGENERATIVE CHANGES: There is moderate chronic facet arthrosis at L4-L5 and L5-S1. SOFT TISSUES: No acute abnormality. IMPRESSION: 1. Moderate chronic facet arthrosis at L4-5 and L5-S1. Electronically signed by: Evalene Coho MD 04/30/2024 11:37 AM EST RP Workstation: [id number]   CT T-SPINE NO CHARGE Result Date: 04/30/2024 EXAM: CT THORACIC SPINE WITH CONTRAST 04/30/2024 11:08:07 AM TECHNIQUE: CT of the thoracic spine was performed with the administration of 75 mL of iohexol  (OMNIPAQUE ) 350 MG/ML injection. Multiplanar reformatted images are provided for review. Automated exposure control, iterative reconstruction, and/or weight based adjustment of the mA/kV was utilized to reduce the radiation dose to as low as reasonably achievable. COMPARISON: None available. CLINICAL HISTORY: FINDINGS: BONES AND ALIGNMENT: Normal vertebral body heights. No acute fracture or suspicious bone lesion. Normal alignment. DEGENERATIVE CHANGES: There is multilevel degenerative disc disease within the thoracic spine. SOFT TISSUES: No acute abnormality. IMPRESSION: 1. No acute abnormality of the thoracic spine. 2. Multilevel degenerative disc disease within the thoracic spine. Electronically signed by: Evalene Coho MD 04/30/2024 11:36 AM EST RP Workstation: [id number]   CT CHEST ABDOMEN PELVIS W CONTRAST Result Date: 04/30/2024 EXAM: CT CHEST, ABDOMEN AND PELVIS WITH CONTRAST 04/30/2024 11:08:07 AM TECHNIQUE: CT of the chest, abdomen and pelvis was performed with the administration of 75 mL of iohexol  (OMNIPAQUE ) 350 MG/ML injection. Multiplanar reformatted images are provided for review. Automated exposure control, iterative reconstruction, and/or  weight based adjustment of the mA/kV was utilized to reduce the radiation dose to as low as reasonably achievable. COMPARISON: None available. CLINICAL HISTORY:  Polytrauma, blunt. FINDINGS: CHEST: MEDIASTINUM AND LYMPH NODES: Heart and pericardium are unremarkable. Moderate calcific coronary artery disease. The central airways are clear. The thoracic aorta demonstrates mild calcific atheromatous disease. No mediastinal, hilar or axillary lymphadenopathy. LUNGS AND PLEURA: There are ground-glass and reticular opacities present dependently within the lungs bilaterally, likely representing combination of mild edema and atelectasis. No pleural effusion or pneumothorax. ABDOMEN AND PELVIS: LIVER: The liver is unremarkable. GALLBLADDER AND BILE DUCTS: Gallbladder is unremarkable. No biliary ductal dilatation. SPLEEN: No acute abnormality. PANCREAS: No acute abnormality. ADRENAL GLANDS: No acute abnormality. KIDNEYS, URETERS AND BLADDER: No stones in the kidneys or ureters. No hydronephrosis. No perinephric or periureteral stranding. Urinary bladder is unremarkable. GI AND BOWEL: Stomach demonstrates no acute abnormality. There are numerous colonic diverticula, but no evidence of diverticulitis. The appendix is not clearly identified. There is no bowel obstruction. REPRODUCTIVE ORGANS: No acute abnormality. PERITONEUM AND RETROPERITONEUM: No ascites. No free air. VASCULATURE: The abdominal aorta is normal in caliber and demonstrates mild calcific atheromatous disease. ABDOMINAL AND PELVIS LYMPH NODES: No lymphadenopathy. REPRODUCTIVE ORGANS: No acute abnormality. BONES AND SOFT TISSUES: The bones are intact. There are mild degenerative changes within the lumbar spine and hips. No focal soft tissue abnormality. IMPRESSION: 1. No acute findings in the chest, abdomen, and pelvis related to polytrauma. 2. Dependent ground-glass and reticular opacities in the lungs bilaterally, likely representing mild edema and atelectasis. 3. Moderate calcific coronary artery disease and mild calcific atheromatous disease in the thoracic and abdominal aorta. 4. Numerous colonic diverticula without  evidence of diverticulitis. Electronically signed by: Evalene Coho MD 04/30/2024 11:34 AM EST RP Workstation: [id number]   CT CERVICAL SPINE WO CONTRAST Result Date: 04/30/2024 EXAM: CT CERVICAL SPINE WITHOUT CONTRAST 04/30/2024 11:08:07 AM TECHNIQUE: CT of the cervical spine was performed without the administration of intravenous contrast. Multiplanar reformatted images are provided for review. Automated exposure control, iterative reconstruction, and/or weight based adjustment of the mA/kV was utilized to reduce the radiation dose to as low as reasonably achievable. COMPARISON: None available. CLINICAL HISTORY: Polytrauma, blunt. FINDINGS: BONES AND ALIGNMENT: There is no evidence of fracture or acute traumatic injury. DEGENERATIVE CHANGES: There is chronic degenerative disc disease present at C3-C4, C5-C6 and C6-C7, with posterior disc bulging and endplate ridging causing mild-to-moderate central spinal canal stenosis at each level. There is also moderate diffuse bilateral facet arthrosis. The right facets at C2-C3 are fused. SOFT TISSUES: No prevertebral soft tissue swelling. IMPRESSION: 1. No evidence of fracture or acute traumatic injury. 2. Chronic degenerative disc disease at C3-4, C5-6, and C6-7 with posterior disc bulging/endplate ridging causing mild-to-moderate central spinal canal stenosis at each level. 3. Moderate diffuse bilateral facet arthrosis. Right facets at C2-3 are fused. Electronically signed by: Evalene Coho MD 04/30/2024 11:29 AM EST RP Workstation: [id number]   CT HEAD WO CONTRAST Result Date: 04/30/2024 EXAM: CT HEAD WITHOUT CONTRAST 04/30/2024 11:08:07 AM TECHNIQUE: CT of the head was performed without the administration of intravenous contrast. Automated exposure control, iterative reconstruction, and/or weight based adjustment of the mA/kV was utilized to reduce the radiation dose to as low as reasonably achievable. COMPARISON: 01/31/2024 CLINICAL HISTORY: Head trauma,  moderate-severe. FINDINGS: BRAIN AND VENTRICLES: No acute hemorrhage. No evidence of acute infarct. No hydrocephalus. No extra-axial collection. No mass effect or midline shift. ORBITS: No acute abnormality. SINUSES: No acute abnormality. SOFT TISSUES AND SKULL:  Left scalp soft tissue swelling and laceration. No skull fracture. IMPRESSION: 1. No acute intracranial abnormality. 2. Left scalp soft tissue swelling and laceration. Electronically signed by: Evalene Coho MD 04/30/2024 11:26 AM EST RP Workstation: [id number]     Discharge Instructions:   Signed: Remonia Kem Jolynn [facility]ene Internal [facility] - PGY1 04/30/2024, 7:09 PM   Please contact the on call pager at: [phone]

## 2024-04-30 NOTE — ED Notes (Signed)
 MD notified of BP after amio admin

## 2024-04-30 NOTE — ED Notes (Signed)
 Patient uncooperative at this time. Patient ripped out right AC IV and pulled off cords refusing to stay in bed. Patient endorses wanting to leave AMA. MD notified.

## 2024-04-30 NOTE — ED Notes (Addendum)
 Pt asked that no one talks about his care to a joshua Malan his son.

## 2024-04-30 NOTE — Hospital Course (Addendum)
 Peter Mccann is a 68 y.o. male with PMH of alcoholic ketosis, CAD, chronic pain syndrome, persistent atrial fibrillation, hyperlipidemia, alcohol abuse, B12 deficiency, anemia, type 2 diabetes mellitus that presented today after a syncopal episode and is being admitted for lactic acidosis   #Lactic acidosis #elevated anion gap Likely secondary to alcohol ketoacidosis.  Patient's lactic acidosis was elevated at the time of presentation to the ED and continue to stay elevated to 5.9 spite being status post 2 L of fluids bolus.  Anion gap of 20.  Patient does have a history of type 2 diabetes so do have to consider DKA,HHS but blood sugars are not elevated.  Not describing any infectious symptoms so lower differential for sepsis.  Patient did have cold lower extremities but good DP pulses bilaterally, lower differential for shock.  CK within normal limits.  Starvation ketosis especially with patient taking Mounjaro will also be on differential.  With his ethanol level do believe that this is more likely alcohol ketoacidosis.  Ketones noted in his urine. -Will treat with D5 and normal saline -Will continue to monitor AG with BMP    #A-fib with RVR During evaluation patient's rates were in the 130s.  He was asymptomatic and denied any chest pain, shortness of breath, palpitations.  He states that he does have a history of this before.  He was started on amiodarone  in the ED while still having elevated rates but only bolus was given.  CHA2DS2-VASc score of 4.  Cardiology was consulted.  Appreciate their assistance. - Recommended holding home Eliquis due to head laceration. - Cardiology recommended stopping IV amiodarone  and starting metoprolol  tartrate 50 mg every 6 hours. - Echocardiogram ordered - TSH ordered - Magnesium  greater than 2 and potassium greater than 4   #CAD status post PCI No chest pain.  Holding Eliquis due to bleeding     # HTN Patient reports not taking his Coreg .  Per  cardiology will transition to metoprolol  to improve rates and hold home Coreg    #Alcohol use disorder Patient's last drink was at 6 AM this morning.  He recently started drinking in the last 3 months he reports to us .  He denies any history of hospitalizations for alcohol withdrawal, history of DT, alcohol withdrawal seizures - IV thiamine  100 mg daily x 3 days, and afterwards will start oral thiamine  supplementation -Will give oral folate supplementation. -CIWA protocol with Ativan . - Counseled on alcohol cessation   #Chronic pain Is being prescribed Norco as an outpatient and reports taking it 4 times daily. -Will resume Norco 4 times daily as needed as patient reported at home   #Type 2 diabetes mellitus Lower differential for DKA or HHS given blood sugars.  Patient is not on Jardiance so do not feel this is euglycemic DKA.  Ketones are noted in urine this is likely due to alcohol ketoacidosis.  Last A1c noted to be 5.7 on 01/24/2024 Will be giving patient D5 and will not add any insulin  at this time.  Continue to monitor blood sugars   #Scalp laceration Per ED provider will have to keep open to avoid any risk of infection as this laceration has been there for the last week.

## 2024-04-30 NOTE — Progress Notes (Signed)
eLINK following for sepsis protocol. ?

## 2024-04-30 NOTE — Progress Notes (Signed)
 Fall on Thinners head injury. Chaplain provided emotional support.  Chaplain available as needed.  Rayleen Levander [place], [facility], Pager [phone]

## 2024-05-01 ENCOUNTER — Telehealth: Payer: Self-pay | Admitting: Family

## 2024-05-01 DIAGNOSIS — W19XXXA Unspecified fall, initial encounter: Secondary | ICD-10-CM

## 2024-05-01 DIAGNOSIS — F10920 Alcohol use, unspecified with intoxication, uncomplicated: Secondary | ICD-10-CM

## 2024-05-01 LAB — BLOOD CULTURE ID PANEL (REFLEXED) - BCID2

## 2024-05-01 NOTE — Telephone Encounter (Signed)
° ° °  [facility]  05/01/2024          BRIEF PROGRESS NOTE:  Received notification that Mr. Peter Mccann's lab work has returned positive for MSSA bacteremia following leaving the hospital AMA. Called cell phone and left HIPAA compliant voicemail with instructions to return to the hospital. Called home number and reached his wife and left same HIPAA complaint message for him to return to the hospital for further evaluation.   Peter Zellie Jenning, NP [facility]  05/01/2024  4:08 PM

## 2024-05-01 NOTE — Progress Notes (Signed)
 PHARMACY - PHYSICIAN COMMUNICATION CRITICAL VALUE ALERT - BLOOD CULTURE IDENTIFICATION (BCID)  Peter Mccann is an 68 y.o. male who presented to [facility] on 04/30/2024 with a chief complaint of syncope  Assessment:  50 YOM with hx EtOH abuse who presented with syncope and recent fall with scalp laceration noted. Now found to 2 of 4 bottles growing GPC in clusters with BCID detecting MSSA.  Name of physician (or Provider) Contacted: ID (Singh/Calone)  Current antibiotics: None - left AMA  Changes to prescribed antibiotics recommended:  The patient left AMA and will need to be contacted to return for admission and re-evaluation  Contacted the patient's home phone and his son answered who verified the patient's information - informed of positive blood cultures and need to return to the hospital but no other information was provided. Son gave the patient's cell phone number - attempted to call without a response. Was told to re-try the patient at his home landline in ~2 hours. Will pass along to the ID team to re-attempt a call around 4 PM.   Results for orders placed or performed during the hospital encounter of 04/30/24  Blood Culture ID Panel (Reflexed) (Collected: 04/30/2024 12:00 PM)  Result Value Ref Range   Enterococcus faecalis NOT DETECTED NOT DETECTED   Enterococcus Faecium NOT DETECTED NOT DETECTED   Listeria monocytogenes NOT DETECTED NOT DETECTED   Staphylococcus species DETECTED (A) NOT DETECTED   Staphylococcus aureus (BCID) DETECTED (A) NOT DETECTED   Staphylococcus epidermidis NOT DETECTED NOT DETECTED   Staphylococcus lugdunensis NOT DETECTED NOT DETECTED   Streptococcus species NOT DETECTED NOT DETECTED   Streptococcus agalactiae NOT DETECTED NOT DETECTED   Streptococcus pneumoniae NOT DETECTED NOT DETECTED   Streptococcus pyogenes NOT DETECTED NOT DETECTED   A.calcoaceticus-baumannii NOT DETECTED NOT DETECTED   Bacteroides fragilis NOT DETECTED NOT DETECTED    Enterobacterales NOT DETECTED NOT DETECTED   Enterobacter cloacae complex NOT DETECTED NOT DETECTED   Escherichia coli NOT DETECTED NOT DETECTED   Klebsiella aerogenes NOT DETECTED NOT DETECTED   Klebsiella oxytoca NOT DETECTED NOT DETECTED   Klebsiella pneumoniae NOT DETECTED NOT DETECTED   Proteus species NOT DETECTED NOT DETECTED   Salmonella species NOT DETECTED NOT DETECTED   Serratia marcescens NOT DETECTED NOT DETECTED   Haemophilus influenzae NOT DETECTED NOT DETECTED   Neisseria meningitidis NOT DETECTED NOT DETECTED   Pseudomonas aeruginosa NOT DETECTED NOT DETECTED   Stenotrophomonas maltophilia NOT DETECTED NOT DETECTED   Candida albicans NOT DETECTED NOT DETECTED   Candida auris NOT DETECTED NOT DETECTED   Candida glabrata NOT DETECTED NOT DETECTED   Candida krusei NOT DETECTED NOT DETECTED   Candida parapsilosis NOT DETECTED NOT DETECTED   Candida tropicalis NOT DETECTED NOT DETECTED   Cryptococcus neoformans/gattii NOT DETECTED NOT DETECTED   Meth resistant mecA/C and MREJ NOT DETECTED NOT DETECTED   Thank you for allowing pharmacy to be a part of this patients care.  Almarie Lunger, PharmD, BCPS, BCIDP Infectious Diseases Clinical Pharmacist 05/01/2024 2:11 PM   **Pharmacist phone directory can now be found on [email or web address] ([email or web address]).  Listed under [facility].

## 2024-05-03 LAB — CULTURE, BLOOD (ROUTINE X 2): Special Requests: ADEQUATE

## 2024-05-04 ENCOUNTER — Telehealth (HOSPITAL_BASED_OUTPATIENT_CLINIC_OR_DEPARTMENT_OTHER): Payer: Self-pay | Admitting: *Deleted

## 2024-05-04 NOTE — Telephone Encounter (Signed)
 Post ED Visit - Positive Culture Follow-up: Successful Patient Follow-Up  Culture assessed and recommendations reviewed by:  []  Rankin Dee, Pharm.D. []  Venetia Gully, Pharm.D., BCPS AQ-ID []  Garrel Crews, Pharm.D., BCPS []  Almarie Lunger, [address].D., BCPS []  Hecker, [address].D., BCPS, AAHIVP []  Rosaline Bihari, Pharm.D., BCPS, AAHIVP []  Vernell Meier, PharmD, BCPS []  Latanya Hint, PharmD, BCPS []  Donald Medley, PharmD, BCPS [x]  Dorn Buttner,  PharmD  Positive blood culture  [x]  Patient discharged without antimicrobial prescription and treatment is now indicated []  Organism is resistant to prescribed ED discharge antimicrobial []  Patient with positive blood cultures  Changes discussed with ED provider: Dr. Freddi   Pt returned call and I advised that he needs to return to hospital for evaluation.  Pt understands instructions but wants to speak with his PCP because he is feeling fine. I reiterated importance of him returning for treatment. Pt stated if his PCP feels like he still needs to come he will come.  Contacted patient, date 05/04/24, time 1426   Peter Mccann 05/04/2024, 2:25 PM

## 2024-05-04 NOTE — Telephone Encounter (Signed)
 Post ED Visit - Positive Culture Follow-up: Unsuccessful Patient Follow-up  Culture assessed and recommendations reviewed by:  [x]  Dorn Buttner, Pharm.D. []  Venetia Gully, Pharm.D., BCPS AQ-ID []  Garrel Crews, Pharm.D., BCPS []  Almarie Lunger, Pharm.D., BCPS []  [place], [place].D., BCPS, AAHIVP []  Rosaline Bihari, Pharm.D., BCPS, AAHIVP []  Massie Rigg, PharmD []  Jodie Rower, PharmD, BCPS  Positive blood culture  [x]  Patient discharged without antimicrobial prescription and treatment is now indicated []  Organism is resistant to prescribed ED discharge antimicrobial []  Patient with positive blood cultures  Plan: Called pt to advise to return to hospital for evaluation per Dr. Freddi. .No answer.  Unable to contact patient, letter will be sent to address on file  Albino Alan Novak 05/04/2024, 1:12 PM

## 2024-05-04 NOTE — Progress Notes (Signed)
 ED Antimicrobial Stewardship Positive Culture Follow Up   Peter Mccann is an 68 y.o. male who presented to [facility] on @ADMITDT @ with a chief complaint of  Chief Complaint  Patient presents with   Fall    Level 2    Head Injury    Recent Results (from the past 720 hours)  Resp panel by RT-PCR (RSV, Flu A&B, Covid) Anterior Nasal Swab     Status: None   Collection Time: 04/30/24 10:45 AM   Specimen: Anterior Nasal Swab  Result Value Ref Range Status   SARS Coronavirus 2 by RT PCR NEGATIVE NEGATIVE Final   Influenza A by PCR NEGATIVE NEGATIVE Final   Influenza B by PCR NEGATIVE NEGATIVE Final    Comment: (NOTE) The Xpert Xpress SARS-CoV-2/FLU/RSV plus assay is intended as an aid in the diagnosis of influenza from Nasopharyngeal swab specimens and should not be used as a sole basis for treatment. Nasal washings and aspirates are unacceptable for Xpert Xpress SARS-CoV-2/FLU/RSV testing.  Fact Sheet for Patients: bloggercourse.com  Fact Sheet for Healthcare Providers: seriousbroker.it  This test is not yet approved or cleared by the [place]  FDA and has been authorized for detection and/or diagnosis of SARS-CoV-2 by FDA under an Emergency Use Authorization (EUA). This EUA will remain in effect (meaning this test can be used) for the duration of the COVID-19 declaration under Section 564(b)(1) of the Act, 21 U.S.C. section 360bbb-3(b)(1), unless the authorization is terminated or revoked.     Resp Syncytial Virus by PCR NEGATIVE NEGATIVE Final    Comment: (NOTE) Fact Sheet for Patients: bloggercourse.com  Fact Sheet for Healthcare Providers: seriousbroker.it  This test is not yet approved or cleared by the [place]  FDA and has been authorized for detection and/or diagnosis of SARS-CoV-2 by FDA under an Emergency Use Authorization (EUA). This EUA will  remain in effect (meaning this test can be used) for the duration of the COVID-19 declaration under Section 564(b)(1) of the Act, 21 U.S.C. section 360bbb-3(b)(1), unless the authorization is terminated or revoked.  Performed at [facility], [address], [place], [place] [address]   Blood culture (routine x 2)     Status: Abnormal   Collection Time: 04/30/24 12:00 PM   Specimen: BLOOD  Result Value Ref Range Status   Specimen Description BLOOD RIGHT ANTECUBITAL  Final   Special Requests   Final    BOTTLES DRAWN AEROBIC AND ANAEROBIC Blood Culture adequate volume   Culture  Setup Time   Final    GRAM POSITIVE COCCI IN CLUSTERS IN BOTH AEROBIC AND ANAEROBIC BOTTLES CRITICAL RESULT CALLED TO, READ BACK BY AND VERIFIED WITH: MAYA ALMARIE HERO [id number] FCP Performed at [facility], [address], [place], [place] [address]    Culture STAPHYLOCOCCUS AUREUS (A)  Final   Report Status 05/03/2024 FINAL  Final   Organism ID, Bacteria STAPHYLOCOCCUS AUREUS  Final      Susceptibility   Staphylococcus aureus - MIC*    CIPROFLOXACIN <=0.5 SENSITIVE Sensitive     ERYTHROMYCIN <=0.25 SENSITIVE Sensitive     GENTAMICIN <=0.5 SENSITIVE Sensitive     OXACILLIN <=0.25 SENSITIVE Sensitive     TETRACYCLINE <=1 SENSITIVE Sensitive     VANCOMYCIN 1 SENSITIVE Sensitive     TRIMETH/SULFA <=10 SENSITIVE Sensitive     CLINDAMYCIN <=0.25 SENSITIVE Sensitive     RIFAMPIN <=0.5 SENSITIVE Sensitive     Inducible Clindamycin NEGATIVE Sensitive     LINEZOLID 2 SENSITIVE Sensitive     *  STAPHYLOCOCCUS AUREUS  Blood Culture ID Panel (Reflexed)     Status: Abnormal   Collection Time: 04/30/24 12:00 PM  Result Value Ref Range Status   Enterococcus faecalis NOT DETECTED NOT DETECTED Final   Enterococcus Faecium NOT DETECTED NOT DETECTED Final   Listeria monocytogenes NOT DETECTED NOT DETECTED Final   Staphylococcus species DETECTED (A) NOT DETECTED Final    Comment: CRITICAL RESULT  CALLED TO, READ BACK BY AND VERIFIED WITH: PHARMD ELIZABETH M [id number] FCP    Staphylococcus aureus (BCID) DETECTED (A) NOT DETECTED Final    Comment: CRITICAL RESULT CALLED TO, READ BACK BY AND VERIFIED WITH: PHARMD ELIZABETH M [id number] FCP    Staphylococcus epidermidis NOT DETECTED NOT DETECTED Final   Staphylococcus lugdunensis NOT DETECTED NOT DETECTED Final   Streptococcus species NOT DETECTED NOT DETECTED Final   Streptococcus agalactiae NOT DETECTED NOT DETECTED Final   Streptococcus pneumoniae NOT DETECTED NOT DETECTED Final   Streptococcus pyogenes NOT DETECTED NOT DETECTED Final   A.calcoaceticus-baumannii NOT DETECTED NOT DETECTED Final   Bacteroides fragilis NOT DETECTED NOT DETECTED Final   Enterobacterales NOT DETECTED NOT DETECTED Final   Enterobacter cloacae complex NOT DETECTED NOT DETECTED Final   Escherichia coli NOT DETECTED NOT DETECTED Final   Klebsiella aerogenes NOT DETECTED NOT DETECTED Final   Klebsiella oxytoca NOT DETECTED NOT DETECTED Final   Klebsiella pneumoniae NOT DETECTED NOT DETECTED Final   Proteus species NOT DETECTED NOT DETECTED Final   Salmonella species NOT DETECTED NOT DETECTED Final   Serratia marcescens NOT DETECTED NOT DETECTED Final   Haemophilus influenzae NOT DETECTED NOT DETECTED Final   Neisseria meningitidis NOT DETECTED NOT DETECTED Final   Pseudomonas aeruginosa NOT DETECTED NOT DETECTED Final   Stenotrophomonas maltophilia NOT DETECTED NOT DETECTED Final   Candida albicans NOT DETECTED NOT DETECTED Final   Candida auris NOT DETECTED NOT DETECTED Final   Candida glabrata NOT DETECTED NOT DETECTED Final   Candida krusei NOT DETECTED NOT DETECTED Final   Candida parapsilosis NOT DETECTED NOT DETECTED Final   Candida tropicalis NOT DETECTED NOT DETECTED Final   Cryptococcus neoformans/gattii NOT DETECTED NOT DETECTED Final   Meth resistant mecA/C and MREJ NOT DETECTED NOT DETECTED Final    Comment: Performed at [facility], [address], [place], [place] [address]   [x]  ID has attempted multiple times to contact patient with instructions to go to hospital for treatment of above. They have been able to speak with son and wife per notes, but contact with patient has not been made, and I cannot see where patient has checked in to an OSH.  Please attempt to contact patient again and tell patient of ID's recommendation to return to hospital for treatment.  ED Provider: Freddi Dorn Buttner, PharmD, BCPS 05/04/2024 10:26 AM ED Clinical Pharmacist -  [phone]

## 2024-05-27 ENCOUNTER — Encounter
Admission: RE | Disposition: A | Discharge status: Home / Self Care | Payer: Self-pay | Source: Home / Self Care | Attending: Cardiology

## 2024-05-27 ENCOUNTER — Ambulatory Visit: Admitting: Anesthesiology

## 2024-05-27 ENCOUNTER — Ambulatory Visit
Admission: RE | Admit: 2024-05-27 | Discharge: 2024-05-27 | Disposition: A | Discharge status: Home / Self Care | Attending: Cardiology | Admitting: Cardiology

## 2024-05-27 ENCOUNTER — Encounter: Payer: Self-pay | Admitting: Cardiology

## 2024-05-27 DIAGNOSIS — I4891 Unspecified atrial fibrillation: Secondary | ICD-10-CM | POA: Insufficient documentation

## 2024-05-27 DIAGNOSIS — I10 Essential (primary) hypertension: Secondary | ICD-10-CM | POA: Insufficient documentation

## 2024-05-27 DIAGNOSIS — I2510 Atherosclerotic heart disease of native coronary artery without angina pectoris: Secondary | ICD-10-CM | POA: Insufficient documentation

## 2024-05-27 DIAGNOSIS — E119 Type 2 diabetes mellitus without complications: Secondary | ICD-10-CM | POA: Insufficient documentation

## 2024-05-27 DIAGNOSIS — I4819 Other persistent atrial fibrillation: Secondary | ICD-10-CM

## 2024-05-27 HISTORY — PX: CARDIOVERSION: SHX1299

## 2024-05-27 LAB — GLUCOSE, CAPILLARY: Glucose-Capillary: 86 mg/dL (ref 70–99)

## 2024-05-27 MED ORDER — PROPOFOL 10 MG/ML IV BOLUS
INTRAVENOUS | Status: AC
  Filled 2024-05-27: qty 20

## 2024-05-27 MED ORDER — PROPOFOL 10 MG/ML IV BOLUS
INTRAVENOUS | Status: DC | PRN
  Administered 2024-05-27: 30 mg via INTRAVENOUS
  Administered 2024-05-27: 60 mg via INTRAVENOUS

## 2024-05-27 MED ORDER — DEXAMETHASONE SOD PHOSPHATE PF 10 MG/ML IJ SOLN
INTRAMUSCULAR | Status: AC
  Filled 2024-05-27: qty 1

## 2024-05-27 MED ORDER — ONDANSETRON HCL 4 MG/2ML IJ SOLN
INTRAMUSCULAR | Status: AC
  Filled 2024-05-27: qty 2

## 2024-05-27 MED ORDER — LIDOCAINE HCL (PF) 2 % IJ SOLN
INTRAMUSCULAR | Status: AC
  Filled 2024-05-27: qty 5

## 2024-05-27 MED ORDER — SODIUM CHLORIDE 0.9 % IV SOLN: INTRAVENOUS | Status: DC

## 2024-05-27 NOTE — Procedures (Signed)
 Electrical Cardioversion Procedure Note  Indication: Atrial Fibrillation  Procedure Details: Consent: Indication, Risk/benefits of procedure as well as the alternatives explained to patient and informed consent obtained. Time out performed. Verified patient identification, verified procedure, verified correct patient position, special equipment/implants available, medications/allergies/relevent history reviewed, required imaging and test results reviewed.  Deep sedation was provided by anesthesia with propofol . Patient was delivered with 200 Joules of electricity X 1 with success to Sinus rhythm. Patient tolerated the procedure well. No immediate complication noted.   Successful cardioversion  Keller Paterson, MD [facility] Cardiology- [facility]

## 2024-05-27 NOTE — Anesthesia Postprocedure Evaluation (Signed)
"   Anesthesia Post Note  Patient: Peter Mccann  Procedure(s) Performed: CARDIOVERSION  Patient location during evaluation: Endoscopy Anesthesia Type: General Level of consciousness: awake and alert Pain management: pain level controlled Vital Signs Assessment: post-procedure vital signs reviewed and stable Respiratory status: spontaneous breathing, nonlabored ventilation, respiratory function stable and patient connected to nasal cannula oxygen Cardiovascular status: blood pressure returned to baseline and stable Postop Assessment: no apparent nausea or vomiting Anesthetic complications: no   There were no known notable events for this encounter.   Last Vitals:  Vitals:   05/27/24 1245 05/27/24 1300  BP: 104/81 113/74  Pulse: 69 66  Resp: 10 16  Temp:    SpO2: 96% 97%    Last Pain:  Vitals:   05/27/24 1245  TempSrc:   PainSc: 0-No pain                 [name]      "

## 2024-05-27 NOTE — Anesthesia Preprocedure Evaluation (Signed)
 "                                  Anesthesia Evaluation  Patient identified by MRN, date of birth, ID band Patient awake    Reviewed: Allergy & Precautions, NPO status , Patient's Chart, lab work & pertinent test results  Airway Mallampati: III  TM Distance: >3 FB Neck ROM: full    Dental no notable dental hx.    Pulmonary neg pulmonary ROS   Pulmonary exam normal        Cardiovascular hypertension, On Medications + CAD  + dysrhythmias Atrial Fibrillation      Neuro/Psych negative neurological ROS  negative psych ROS   GI/Hepatic negative GI ROS,,,(+)     substance abuse  alcohol use  Endo/Other  negative endocrine ROSdiabetes, Type 2    Renal/GU negative Renal ROS  negative genitourinary   Musculoskeletal   Abdominal   Peds  Hematology  (+) Blood dyscrasia, anemia   Anesthesia Other Findings Past Medical History: No date: Abdominal pain No date: Alcohol abuse No date: Anemia No date: Arthritis No date: B12 deficiency No date: BMI 40.0-44.9, adult (HCC) No date: Depression No date: Diabetes (HCC) No date: High cholesterol No date: Hypertension 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 No date: Substance abuse (HCC)  Past Surgical History: No date: ANKLE SURGERY; Right 03/30/2023: CARDIOVERSION; N/A     Comment:  Procedure: CARDIOVERSION;  Surgeon: Dewane Shiner,               DO;  Location: [facility] ORS;  Service: Cardiovascular;                Laterality: N/A; 07/23/2017: COLONOSCOPY WITH PROPOFOL ; N/A     Comment:  Procedure: COLONOSCOPY WITH PROPOFOL ;  Surgeon: Viktoria Lamar DASEN, MD;  Location: [facility] ENDOSCOPY;  Service:               Endoscopy;  Laterality: N/A; 11/06/2018: CORONARY STENT INTERVENTION; N/A     Comment:  Procedure: CORONARY STENT INTERVENTION;  Surgeon:               Ammon Blunt, MD;  Location: [facility] INVASIVE CV               LAB;  Service: Cardiovascular;  Laterality: N/A; 07/23/2017:  ESOPHAGOGASTRODUODENOSCOPY (EGD) WITH PROPOFOL ; N/A     Comment:  Procedure: ESOPHAGOGASTRODUODENOSCOPY (EGD) WITH               PROPOFOL ;  Surgeon: Viktoria Lamar DASEN, MD;  Location:               [facility] ENDOSCOPY;  Service: Endoscopy;  Laterality: N/A; 11/06/2018: LEFT HEART CATH AND CORONARY ANGIOGRAPHY; Left     Comment:  Procedure: LEFT HEART CATH AND CORONARY ANGIOGRAPHY;                Surgeon: Ammon Blunt, MD;  Location: [facility]               INVASIVE CV LAB;  Service: Cardiovascular;  Laterality:               Left;  BMI    Body Mass Index: 27.32 kg/m      Reproductive/Obstetrics negative OB ROS  Anesthesia Physical Anesthesia Plan  ASA: 3  Anesthesia Plan: General   Post-op Pain Management: Minimal or no pain anticipated   Induction: Intravenous  PONV Risk Score and Plan: 1 and Propofol  infusion and TIVA  Airway Management Planned: Natural Airway and Nasal Cannula  Additional Equipment:   Intra-op Plan:   Post-operative Plan:   Informed Consent: I have reviewed the patients History and Physical, chart, labs and discussed the procedure including the risks, benefits and alternatives for the proposed anesthesia with the patient or authorized representative who has indicated his/her understanding and acceptance.     Dental Advisory Given  Plan Discussed with: Anesthesiologist, CRNA and Surgeon  Anesthesia Plan Comments: (Patient consented for risks of anesthesia including but not limited to:  - adverse reactions to medications - risk of airway placement if required - damage to eyes, teeth, lips or other oral mucosa - nerve damage due to positioning  - sore throat or hoarseness - Damage to heart, brain, nerves, lungs, other parts of body or loss of life  Patient voiced understanding and assent.)        Anesthesia Quick Evaluation  "

## 2024-05-27 NOTE — Transfer of Care (Signed)
 Immediate Anesthesia Transfer of Care Note  Patient: Peter Mccann  Procedure(s) Performed: CARDIOVERSION  Patient Location: Cath Lab  Anesthesia Type:General  Level of Consciousness: awake and alert   Airway & Oxygen Therapy: Patient Spontanous Breathing and Patient connected to nasal cannula oxygen  Post-op Assessment: Report given to RN  Post vital signs: stable  Last Vitals:  Vitals Value Taken Time  BP 97/67 05/27/24 12:27  Temp    Pulse 63 05/27/24 12:28  Resp 17 05/27/24 12:28  SpO2 100 % 05/27/24 12:28    Last Pain:  Vitals:   05/27/24 1124  TempSrc: Temporal  PainSc: 8          Complications: There were no known notable events for this encounter.

## 2024-05-28 ENCOUNTER — Encounter: Payer: Self-pay | Admitting: Cardiology
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
## 2024-06-04 ENCOUNTER — Other Ambulatory Visit: Payer: Self-pay | Admitting: Student

## 2024-06-04 NOTE — Telephone Encounter (Signed)
 NOT [facility] PATIENT
